# Patient Record
Sex: Female | Born: 1940 | Race: Black or African American | Hispanic: No | Smoking: Never smoker
Health system: Southern US, Community
[De-identification: ages and names within clinical notes are randomized; demographics above are authoritative.]

## PROBLEM LIST (undated history)

## (undated) DIAGNOSIS — K219 Gastro-esophageal reflux disease without esophagitis: Secondary | ICD-10-CM

## (undated) DIAGNOSIS — M549 Dorsalgia, unspecified: Secondary | ICD-10-CM

## (undated) DIAGNOSIS — I10 Essential (primary) hypertension: Secondary | ICD-10-CM

## (undated) DIAGNOSIS — F32A Depression, unspecified: Secondary | ICD-10-CM

## (undated) DIAGNOSIS — E119 Type 2 diabetes mellitus without complications: Secondary | ICD-10-CM

## (undated) DIAGNOSIS — G8929 Other chronic pain: Secondary | ICD-10-CM

## (undated) DIAGNOSIS — K5900 Constipation, unspecified: Secondary | ICD-10-CM

## (undated) DIAGNOSIS — F329 Major depressive disorder, single episode, unspecified: Secondary | ICD-10-CM

## (undated) HISTORY — PX: BACK SURGERY: SHX140

---

## 2016-10-20 ENCOUNTER — Emergency Department (HOSPITAL_BASED_OUTPATIENT_CLINIC_OR_DEPARTMENT_OTHER)
Admission: EM | Admit: 2016-10-20 | Discharge: 2016-10-20 | Disposition: A | Discharge status: Home / Self Care | Payer: Medicare HMO | Attending: Emergency Medicine | Admitting: Emergency Medicine

## 2016-10-20 ENCOUNTER — Encounter (HOSPITAL_BASED_OUTPATIENT_CLINIC_OR_DEPARTMENT_OTHER): Payer: Self-pay | Admitting: Emergency Medicine

## 2016-10-20 ENCOUNTER — Emergency Department (HOSPITAL_BASED_OUTPATIENT_CLINIC_OR_DEPARTMENT_OTHER): Payer: Medicare HMO

## 2016-10-20 DIAGNOSIS — M5441 Lumbago with sciatica, right side: Secondary | ICD-10-CM | POA: Insufficient documentation

## 2016-10-20 DIAGNOSIS — I10 Essential (primary) hypertension: Secondary | ICD-10-CM | POA: Insufficient documentation

## 2016-10-20 DIAGNOSIS — E119 Type 2 diabetes mellitus without complications: Secondary | ICD-10-CM | POA: Diagnosis not present

## 2016-10-20 DIAGNOSIS — M545 Low back pain: Secondary | ICD-10-CM

## 2016-10-20 DIAGNOSIS — M549 Dorsalgia, unspecified: Secondary | ICD-10-CM | POA: Diagnosis present

## 2016-10-20 DIAGNOSIS — M25551 Pain in right hip: Secondary | ICD-10-CM | POA: Insufficient documentation

## 2016-10-20 DIAGNOSIS — G8929 Other chronic pain: Secondary | ICD-10-CM | POA: Diagnosis not present

## 2016-10-20 HISTORY — DX: Other chronic pain: G89.29

## 2016-10-20 HISTORY — DX: Constipation, unspecified: K59.00

## 2016-10-20 HISTORY — DX: Essential (primary) hypertension: I10

## 2016-10-20 HISTORY — DX: Depression, unspecified: F32.A

## 2016-10-20 HISTORY — DX: Major depressive disorder, single episode, unspecified: F32.9

## 2016-10-20 HISTORY — DX: Gastro-esophageal reflux disease without esophagitis: K21.9

## 2016-10-20 HISTORY — DX: Dorsalgia, unspecified: M54.9

## 2016-10-20 HISTORY — DX: Type 2 diabetes mellitus without complications: E11.9

## 2016-10-20 LAB — URINALYSIS, ROUTINE W REFLEX MICROSCOPIC
Bilirubin Urine: NEGATIVE
GLUCOSE, UA: NEGATIVE mg/dL
Hgb urine dipstick: NEGATIVE
KETONES UR: NEGATIVE mg/dL
NITRITE: NEGATIVE
PROTEIN: NEGATIVE mg/dL
Specific Gravity, Urine: <1.005 — ABNORMAL LOW (ref 1.005–1.030)
pH: 6.5 (ref 5.0–8.0)

## 2016-10-20 LAB — URINALYSIS, MICROSCOPIC (REFLEX)

## 2016-10-20 MED ORDER — CAMPHOR-MENTHOL-METHYL SAL 1.2-5.7-6.3 % EX PTCH: 1.0000 "application " | MEDICATED_PATCH | Freq: Every day | CUTANEOUS | 0 refills | Status: AC

## 2016-10-20 MED ORDER — ACETAMINOPHEN 325 MG PO TABS
650.0000 mg | ORAL_TABLET | Freq: Once | ORAL | Status: AC
  Administered 2016-10-20: 650 mg via ORAL
  Filled 2016-10-20: qty 2

## 2016-10-20 NOTE — Discharge Instructions (Signed)
Please see the information and instructions below regarding your visit.  Your diagnoses today include:  1. Acute right-sided low back pain, with sciatica presence unspecified   2. Pain of right hip joint    About diagnosis. Most episodes of acute low back pain are self-limited. Your exam was reassuring today that the source of your pain is not affecting the spinal cord and nerves that originate in the spinal cord.   If you have a history of disc herniation or arthritis in your spine, the nerves exiting the spine on one side get inflamed. This can cause severe pain. We call this radiculopathy. We do not always know what causes the sudden inflammation. Sometimes radiculopathy can radiate into the hip.  Hip pain can also be caused by inflamed bursa, which was indicated on your xray. This may be chronic.  Tests performed today include: See side panel of your discharge paperwork for testing performed today. Vital signs are listed at the bottom of these instructions.   -Xray of lumbar spine -Xray of hip  Medications prescribed:    Take any prescribed medications only as prescribed, and any over the counter medications only as directed on the packaging.  Please apply salon process needed for relief. You may take Tylenol, 650 mg, as needed for pain. Please do not exceed 4 g of Tylenol in one day. Please see your primary care provider within one week before continuing therapy with Tylenol beyond 7 days.  Home care instructions:   Low back pain gets worse the longer you stay stationary. Please keep moving and walking as tolerated. There are exercises included in this packet to perform as tolerated for your low back pain.   Apply heat to the areas that are painful. Avoid twisting or bending your trunk to lift something. Do not lift anything above 25 lbs while recovering from this flare of low back pain.  Please follow any educational materials contained in this packet.   Follow-up  instructions: Please follow-up with your primary care provider in one week for further evaluation of your symptoms if they are not completely improved.   Please follow up with orthopedics or neurosurgery as needed for reevaluation. This will have to be set up by her primary care provider.  Return instructions:  Please return to the Emergency Department if you experience worsening symptoms.  Please return for any fever or chills in the setting of your back pain, weakness in the muscles of the legs, numbness in your legs and feet that is new or changing, numbness in the area where you wipe, retention of your urine, loss of bowel or bladder control, or problems with walking. Please return if you have any other emergent concerns.  Additional Information:  Please use your cane as needed to ensure that you do not fall. Please continue to move with a near ability to keep those muscles in her back moving.  Your vital signs today were: BP 125/63 (BP Location: Right Arm)    Pulse 64    Temp 98.3 F (36.8 C) (Oral)    Resp 18    Ht 5\' 4"  (1.626 m)    Wt 103.4 kg (228 lb)    SpO2 100%    BMI 39.14 kg/m  If your blood pressure (BP) was elevated on multiple readings during this visit above 130 for the top number or above 80 for the bottom number, please have this repeated by your primary care provider within one month. --------------  Thank you for allowing  [place] to participate in your care today.

## 2016-10-20 NOTE — ED Provider Notes (Signed)
[facility] EMERGENCY DEPARTMENT Provider Note   CSN: [id number] Arrival date & time: 10/20/16  1143     History   Chief Complaint Chief Complaint  Patient presents with  . Back Pain    HPI Laura French is a 76 y.o. female.  HPI   Patient is a 76 year old female with a history of back pain (with multiple surgeries, last in 2013), diabetes mellitus, and hypertension presenting for acute right lower back pain and hip pain. Patient is accompanied by her daughter. History obtained entirely by patient. Patient reports that she woke up with symptoms 2-3 days ago, and she has progressively had more pain with movement as well as inability to bear weight on that right extremity due to the pain today. Patient denies fever, chills, muscular weakness in the extremity, new numbness in the right lower extremity. Patient denies any saddle anesthesia, acute urinary retention, loss of bowel or bladder control. Patient does not use IV drugs. Patient has no history of cancer. Patient denies abdominal pain, flank pain, dysuria, or vaginal discharge. Patient does not drink alcohol. Patient has been trying topical therapies without relief. Patient goes to a pain clinic for her chronic back pain where she receives Zanaflex and what she believes is Cymbalta, however she cannot remember the name.   On chart review, no MRIs are available in this system for baseline.  Past Medical History:  Diagnosis Date  . Chronic back pain   . Constipation   . Depression   . Diabetes mellitus without complication (HCC)   . GERD (gastroesophageal reflux disease)   . Hypertension     There are no active problems to display for this patient.   Past Surgical History:  Procedure Laterality Date  . BACK SURGERY      OB History    No data available       Home Medications    Prior to Admission medications   Not on File    Family History No family history on file.  Social History Social History   Substance Use Topics  . Smoking status: Never Smoker  . Smokeless tobacco: Never Used  . Alcohol use No     Allergies   Hydrocodone and Potassium-containing compounds   Review of Systems Review of Systems  Constitutional: Negative for chills and fever.  Cardiovascular: Negative for leg swelling.  Gastrointestinal: Negative for nausea and vomiting.  Genitourinary: Negative for difficulty urinating, dysuria, flank pain, pelvic pain and vaginal discharge.  Musculoskeletal: Positive for arthralgias, back pain and myalgias.  Neurological: Negative for weakness and numbness.     Physical Exam Updated Vital Signs BP (!) 120/57 (BP Location: Right Arm)   Pulse 66   Temp 98.3 F (36.8 C) (Oral)   Resp 18   Ht 5\' 4"  (1.626 m)   Wt 103.4 kg (228 lb)   SpO2 98%   BMI 39.14 kg/m   Physical Exam  Constitutional: She appears well-developed and well-nourished. No distress.  Sitting comfortably in bed.  HENT:  Head: Normocephalic and atraumatic.  Eyes: Conjunctivae are normal. Right eye exhibits no discharge. Left eye exhibits no discharge.  EOMs normal to gross examination.  Neck: Normal range of motion.  Cardiovascular: Normal rate, regular rhythm and normal heart sounds.   Pulmonary/Chest:  Normal respiratory effort. Patient converses comfortably. No audible wheeze or stridor.  Abdominal: Soft. She exhibits no distension. There is no tenderness. There is no guarding.  Musculoskeletal: She exhibits tenderness. She exhibits no deformity.  No erythema  or edema of right hip. Point tenderness to palpation over greater trochanter, sacroiliac joint. No crepitus palpated. Passive ROM intact to internal and external rotation. Active abduction and adduction of the right hip diminished due to pain.  Neurological: She is alert.  Spine Exam: Inspection/Palpation: no midline tenderness to palpation of lumbar spine. Tenderness to palpation of right lumbar paraspinal musculature. Strength:  5/5 throughout LE bilaterally (hip flexion/extension, adduction/abduction; knee flexion/extension; foot dorsiflexion/plantarflexion, inversion/eversion; great toe inversion) Sensation: Intact to light touch in proximal and distal LE bilaterally Reflexes: `1+ quadriceps and achilles reflexes and symmetric. Gait: Antalgic gait but no evidence of right extremity weakness, foot drop or hemiparesis. Gait is coordinated and hip movements are symmetric.  Skin: Skin is warm and dry. She is not diaphoretic.  Psychiatric: She has a normal mood and affect. Her behavior is normal. Judgment and thought content normal.  Nursing note and vitals reviewed.    ED Treatments / Results  Labs (all labs ordered are listed, but only abnormal results are displayed) Labs Reviewed  URINALYSIS, ROUTINE W REFLEX MICROSCOPIC - Abnormal; Notable for the following:       Result Value   Specific Gravity, Urine <1.005 (*)    Leukocytes, UA TRACE (*)    All other components within normal limits  URINALYSIS, MICROSCOPIC (REFLEX) - Abnormal; Notable for the following:    Bacteria, UA RARE (*)    Squamous Epithelial / LPF 0-5 (*)    All other components within normal limits    EKG  EKG Interpretation None       Radiology No results found.  Procedures Procedures (including critical care time)  Medications Ordered in ED Medications  acetaminophen (TYLENOL) tablet 650 mg (not administered)     Initial Impression / Assessment and Plan / ED Course  I have reviewed the triage vital signs and the nursing notes.  Pertinent labs & imaging results that were available during my care of the patient were reviewed by me and considered in my medical decision making (see chart for details).     Final Clinical Impressions(s) / ED Diagnoses   Final diagnoses:  None   Differential diagnosis includes sciatica, lumbar radiculopathy, sacroiliitis, trochanteric bursitis. Patient denies any concerning symptoms suggestive  of cauda equina requiring urgent imaging at this time such as loss of sensation in the lower extremities, lower extremity weakness, loss of bowel or bladder control, saddle anesthesia, urinary retention, fever/chills, IVDU, or history of cancer. Will proceed with xrays and Tylenol for pain control.   On reevaluation, patient had significant improvement in symptoms after Tylenol. Patient was able to ambulate. Patient demonstrated antalagic gait but was ambulating without difficulty.  Exam demonstrated no  Weakness today. No preceding injury or trauma to suggest acute fracture. Doubt pelvic or urinary pathology for patient's acute back pain, as patient denies urinary symptoms, has no evidence of infection on UA, history/pain not consistent with nephrolithiasis, and has no vaginal discharge. UA did demonstrate some trace leukocytes, but patient is asymptomatic of dysuria, urgency, frequency, or systemic symptoms and this was a contaminated sample. Urine sent for culture and patient informed of these results. Imaging obtained due to history of hardware in spine and need to assess for any new compression fractures, worsening degenerative joint disease, or lytic lesions. Lumbar spine x-ray demonstrates retrolisthesis at L1-L2.No alteration or lucency of hardware at fusions. Severe disc space narrowing and L1-L2 region. 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 His findings are nonacute, and patient significantly improved and was  able to ambulate after treatment with Tylenol, patient instructed to apply [place] for pain control, Tylenol, and follow up with primary care provider to make orthopedic referral. Patient with contraindications for steroid treatment due to diabetes, and with unclear history of muscle strain versus sciatica. Patient given strict return precautions for any symptoms indicating worsening neurologic function in the lower extremities.  Nursing notes reviewed. Vital signs  reviewed. All questions answered by patient and family.  This is a shared visit with Dr. Drema Pry. Patient was independently evaluated by this attending physician. Attending physician consulted in evaluation and discharge management.  New Prescriptions New Prescriptions   No medications on file     Delia Chimes 10/20/16 1821    Nira Conn, MD 10/21/16 [phone]

## 2016-10-20 NOTE — ED Triage Notes (Signed)
Patient states that she has had right sided flank to hip pain - x 2 -3 days  - the patient reports that today she was unable to stand up

## 2016-10-20 NOTE — ED Notes (Signed)
ED Provider at bedside. 

## 2016-10-22 LAB — URINE CULTURE: Culture: 10000 — AB

## 2016-10-23 ENCOUNTER — Telehealth: Payer: Self-pay

## 2016-10-23 NOTE — Telephone Encounter (Signed)
Post ED Visit - Positive Culture Follow-up  Culture report reviewed by antimicrobial stewardship pharmacist:  []  Enzo BiNathan Batchelder, Pharm.D. []  Celedonio MiyamotoJeremy Frens, Pharm.D., BCPS AQ-ID []  Garvin FilaMike Maccia, Pharm.D., BCPS []  Georgina PillionElizabeth Martin, Pharm.D., BCPS []  [place] ValleyMinh Pham, [address] Rainbow BoulevardPharm.D., BCPS, AAHIVP []  Estella HuskMichelle Turner, Pharm.D., BCPS, AAHIVP []  Lysle Pearlachel Rumbarger, PharmD, BCPS [x]  Casilda Carlsaylor Stone, PharmD, BCPS []  Pollyann SamplesAndy Johnston, PharmD, BCPS  Positive urine culture  and no further patient follow-up is required at this time. Per Arthor CaptainAbigail Harris PA Jerry Carasullom, Kaydan Wong Burnett 10/23/2016, 12:14 PM

## 2018-10-24 IMAGING — DX DG LUMBAR SPINE COMPLETE 4+V
5 series · 5 of 5 positions shown · non-contrast
Comparison: None.

CLINICAL DATA: Right-sided flank and hip pain

EXAM:
LUMBAR SPINE - COMPLETE 4+ VIEW

[l-spine ap]
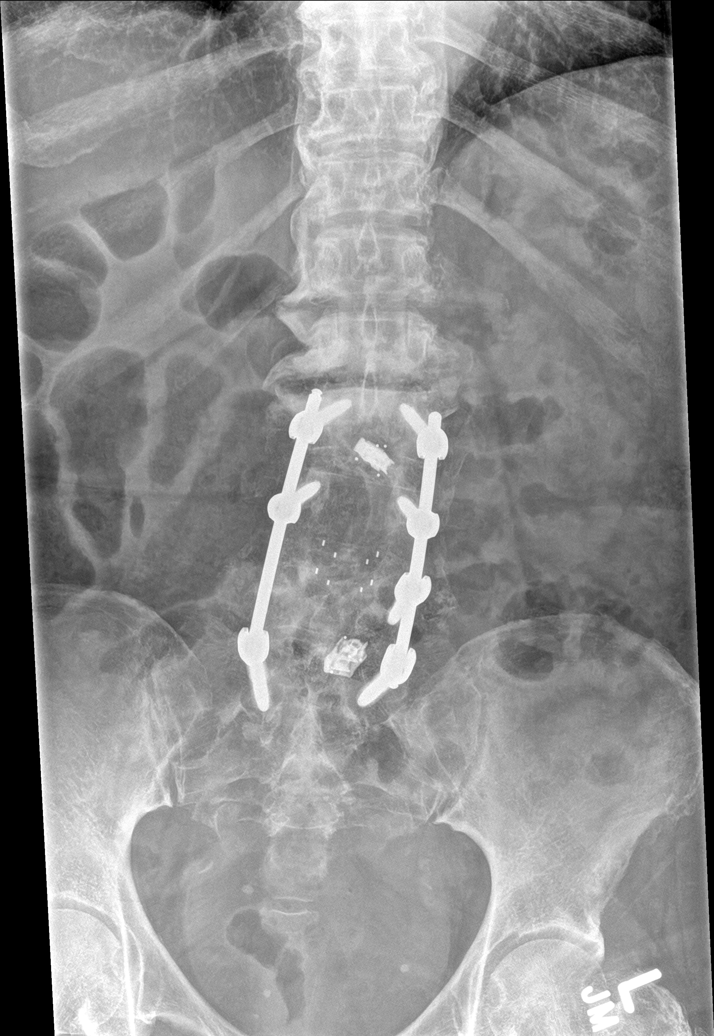

[l-spine obl (1 of 2)]
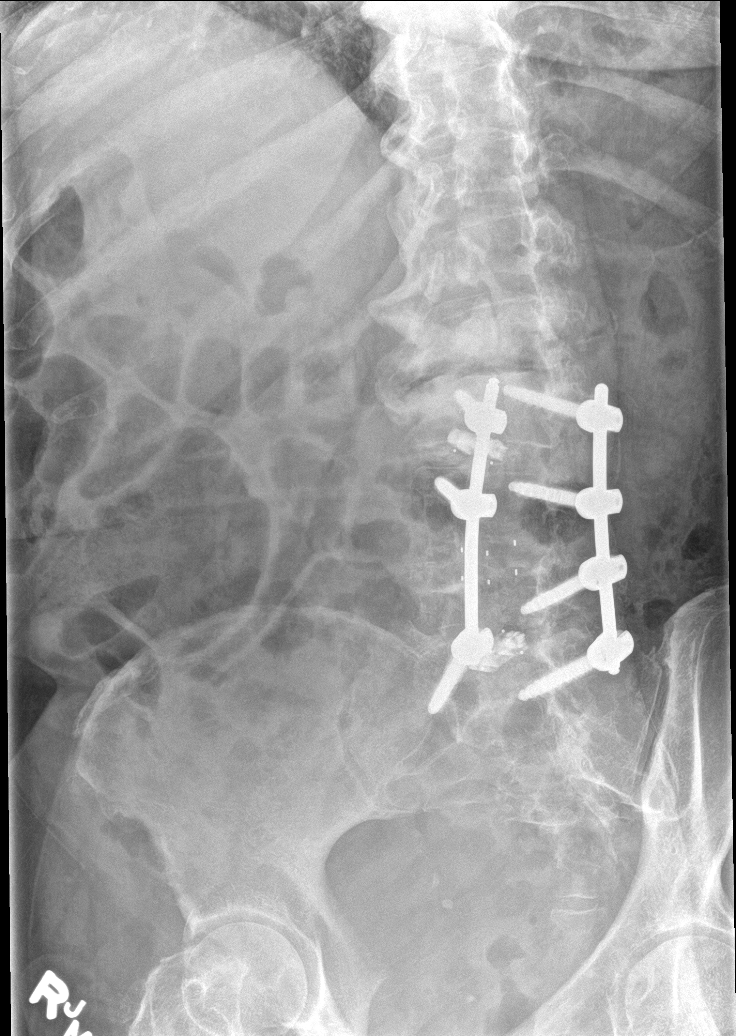

[l-spine obl (2 of 2)]
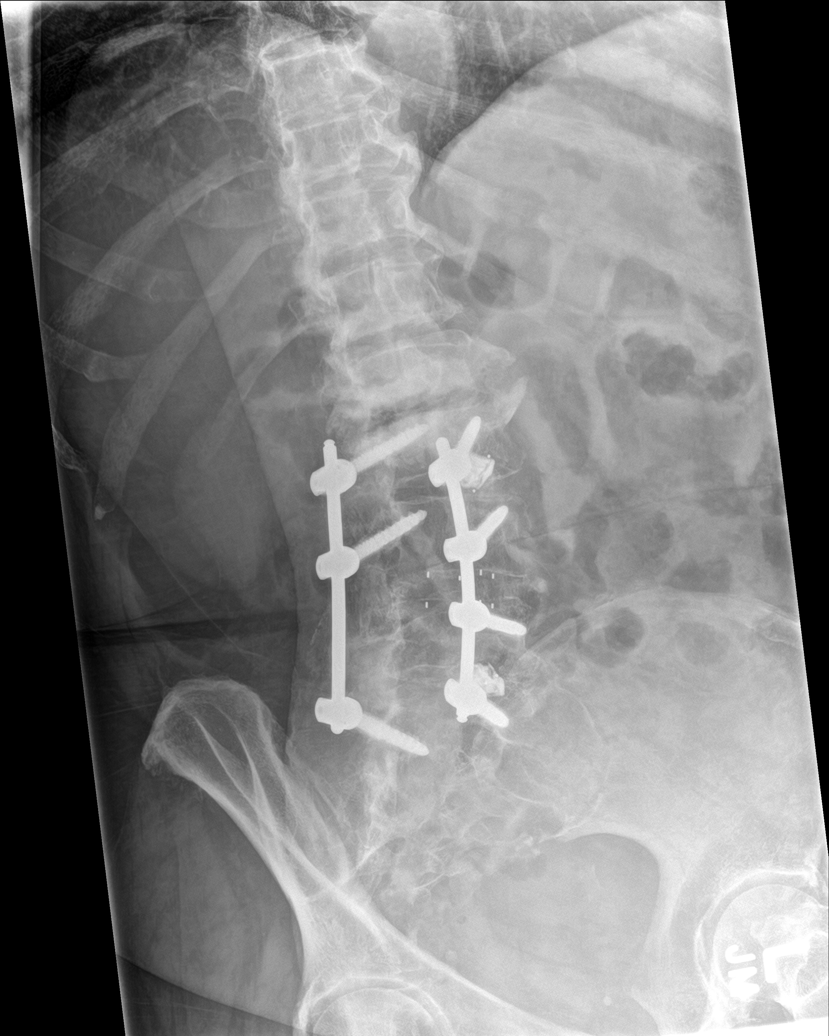

[l-spine lat]
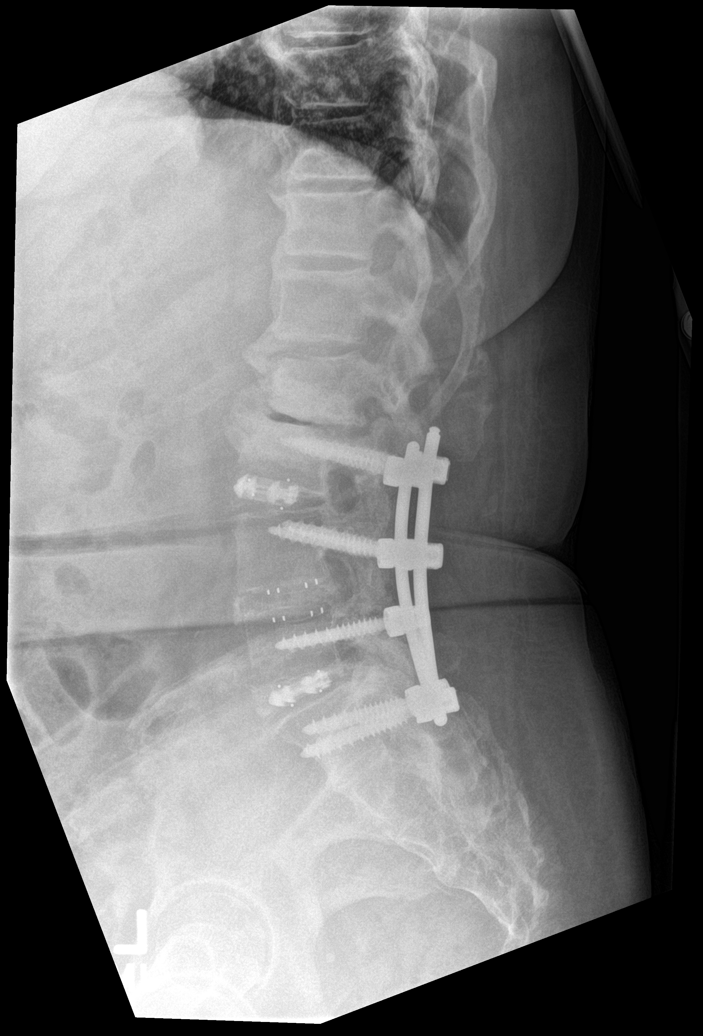

[l-spine spot]
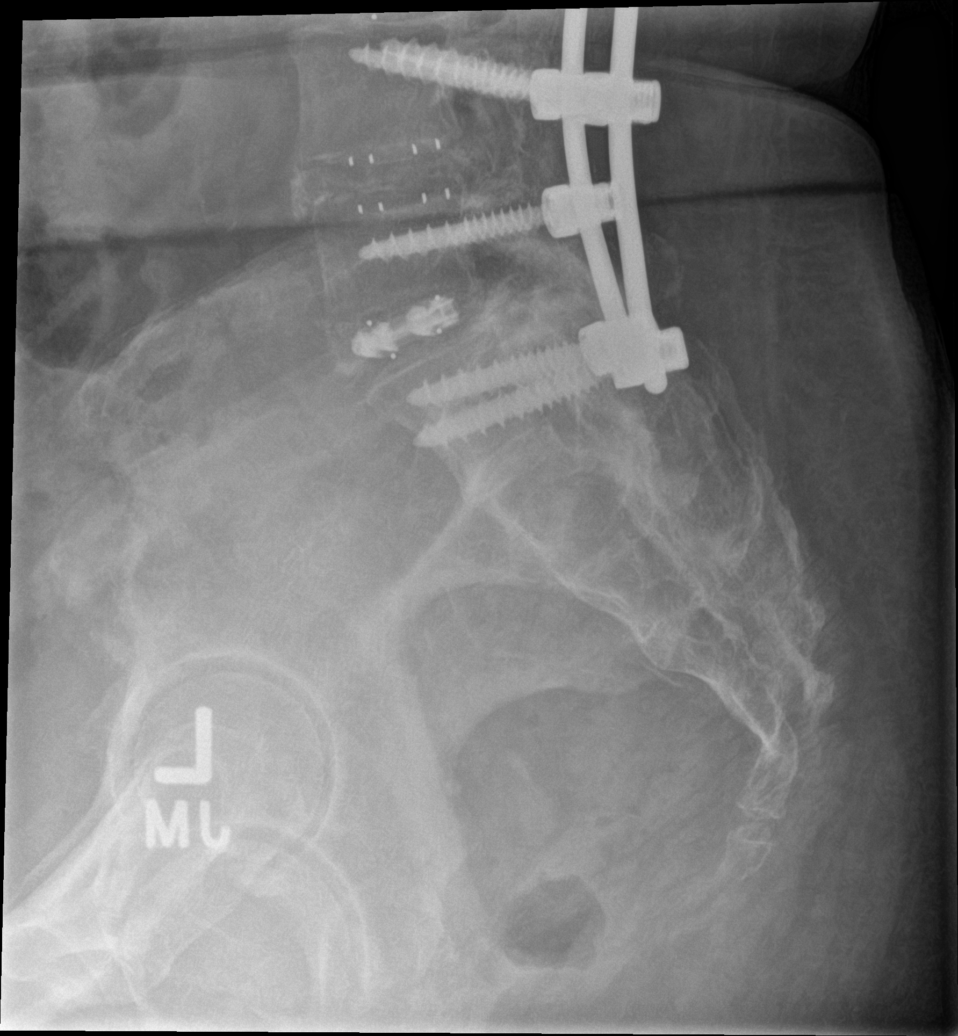

[5 of 5 positions shown; findings below may reference images not displayed]

FINDINGS: There is transitional lumbosacral anatomy with a sacralized L5.
There are hypoplastic ribs at the T12 level. For the purposes of
this dictation, the lowest level of the posterior fusion hardware is
considered L5.

L2-L5 PLIF without abnormal lucency surrounding the hardware. There
is grade 2 retrolisthesis at L1-L2 with severe endplate sclerosis
and narrowing of the disc space. There is moderate to severe
narrowing of the neural foramina at this level. No acute fracture.
There is multilevel lower thoracic mild vertebral body height loss
and anterior osteophyte formation.
IMPRESSION: 1. Transitional lumbar anatomy with sacralized L5.
2. L2-L5 PLIF without adverse features.
3. Grade 2 retrolisthesis at L1-L2 with severe disc space narrowing
and endplate remodeling and moderate to severe bilateral neural
foraminal stenosis.

## 2018-10-24 IMAGING — DX DG HIP (WITH OR WITHOUT PELVIS) 2-3V*R*
3 series · 3 of 3 positions shown · non-contrast
Comparison: None.

CLINICAL DATA: Back pain

EXAM:
DG HIP (WITH OR WITHOUT PELVIS) 2-3V RIGHT

[pelvis ap]
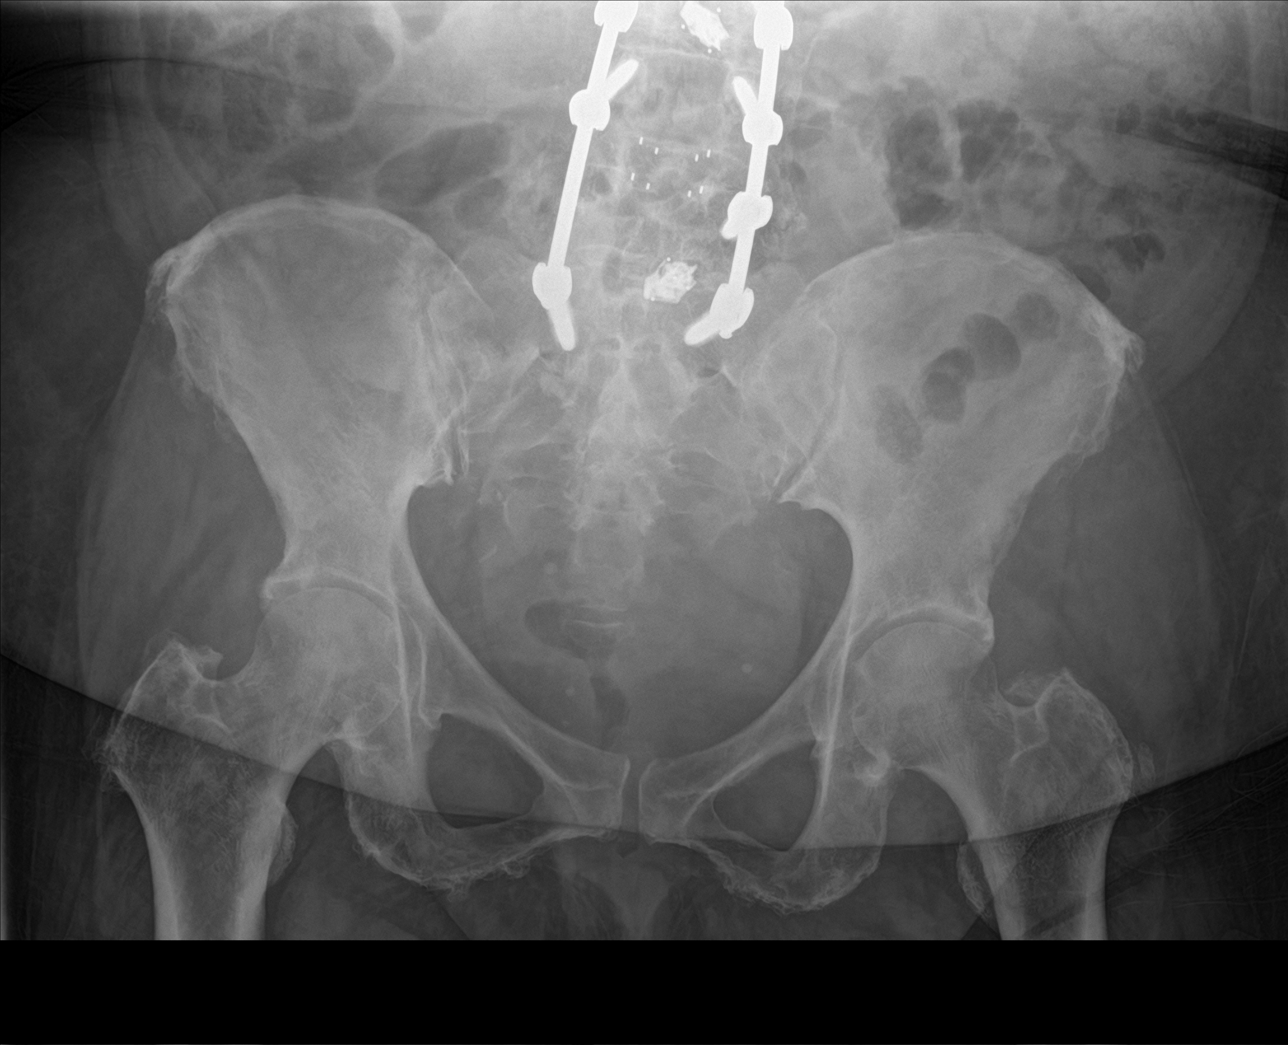

[hip ap]
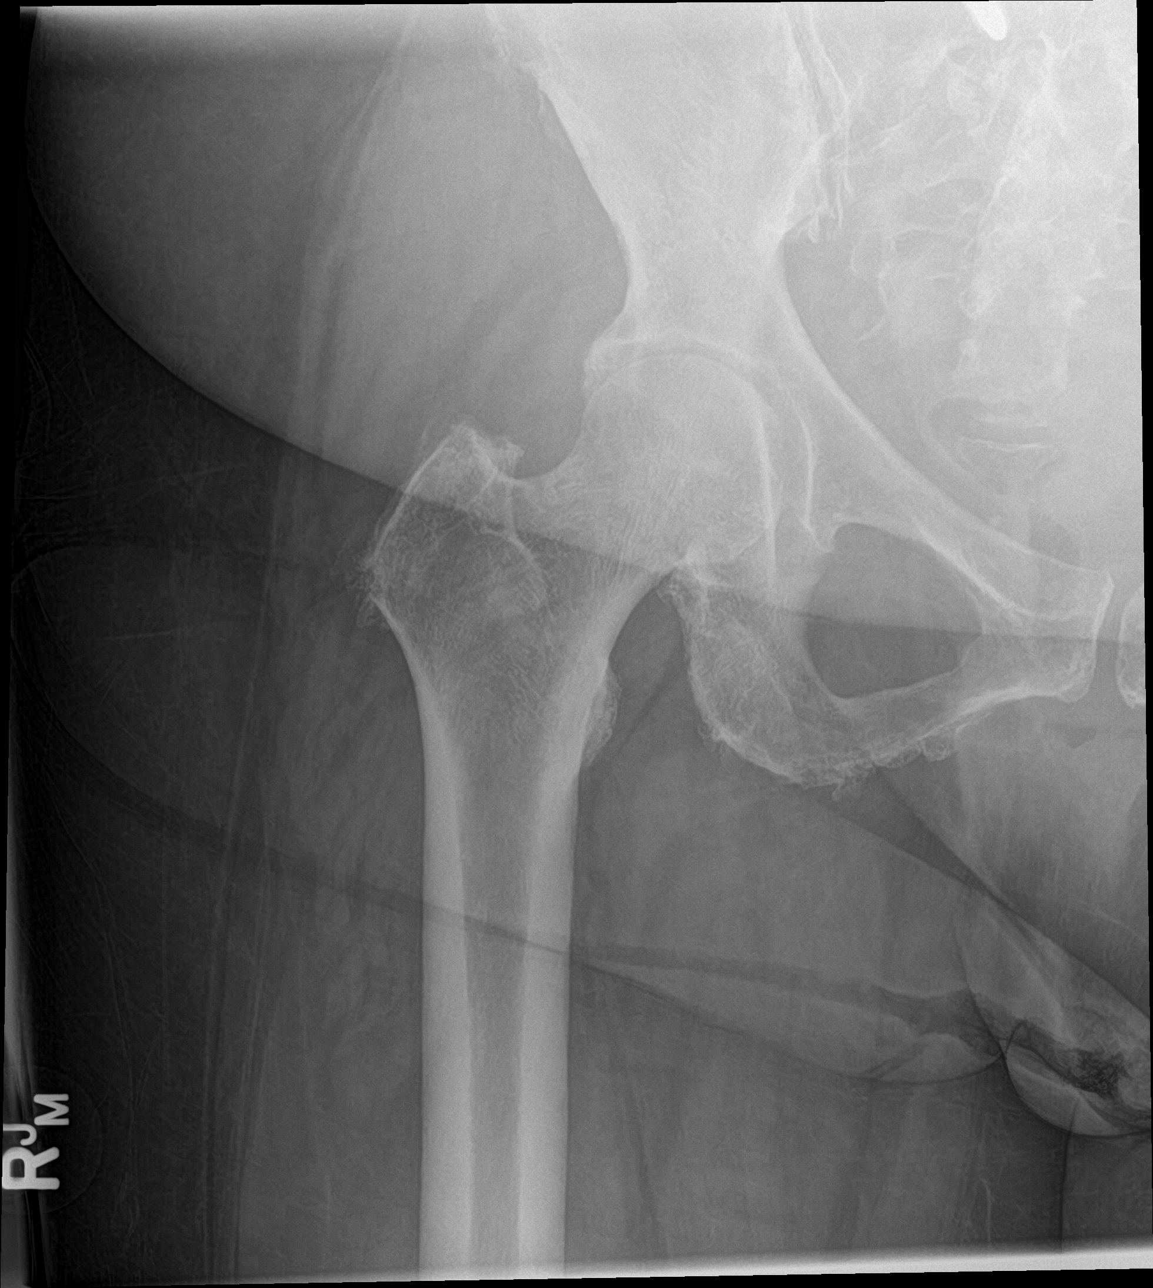

[hip frog leg]
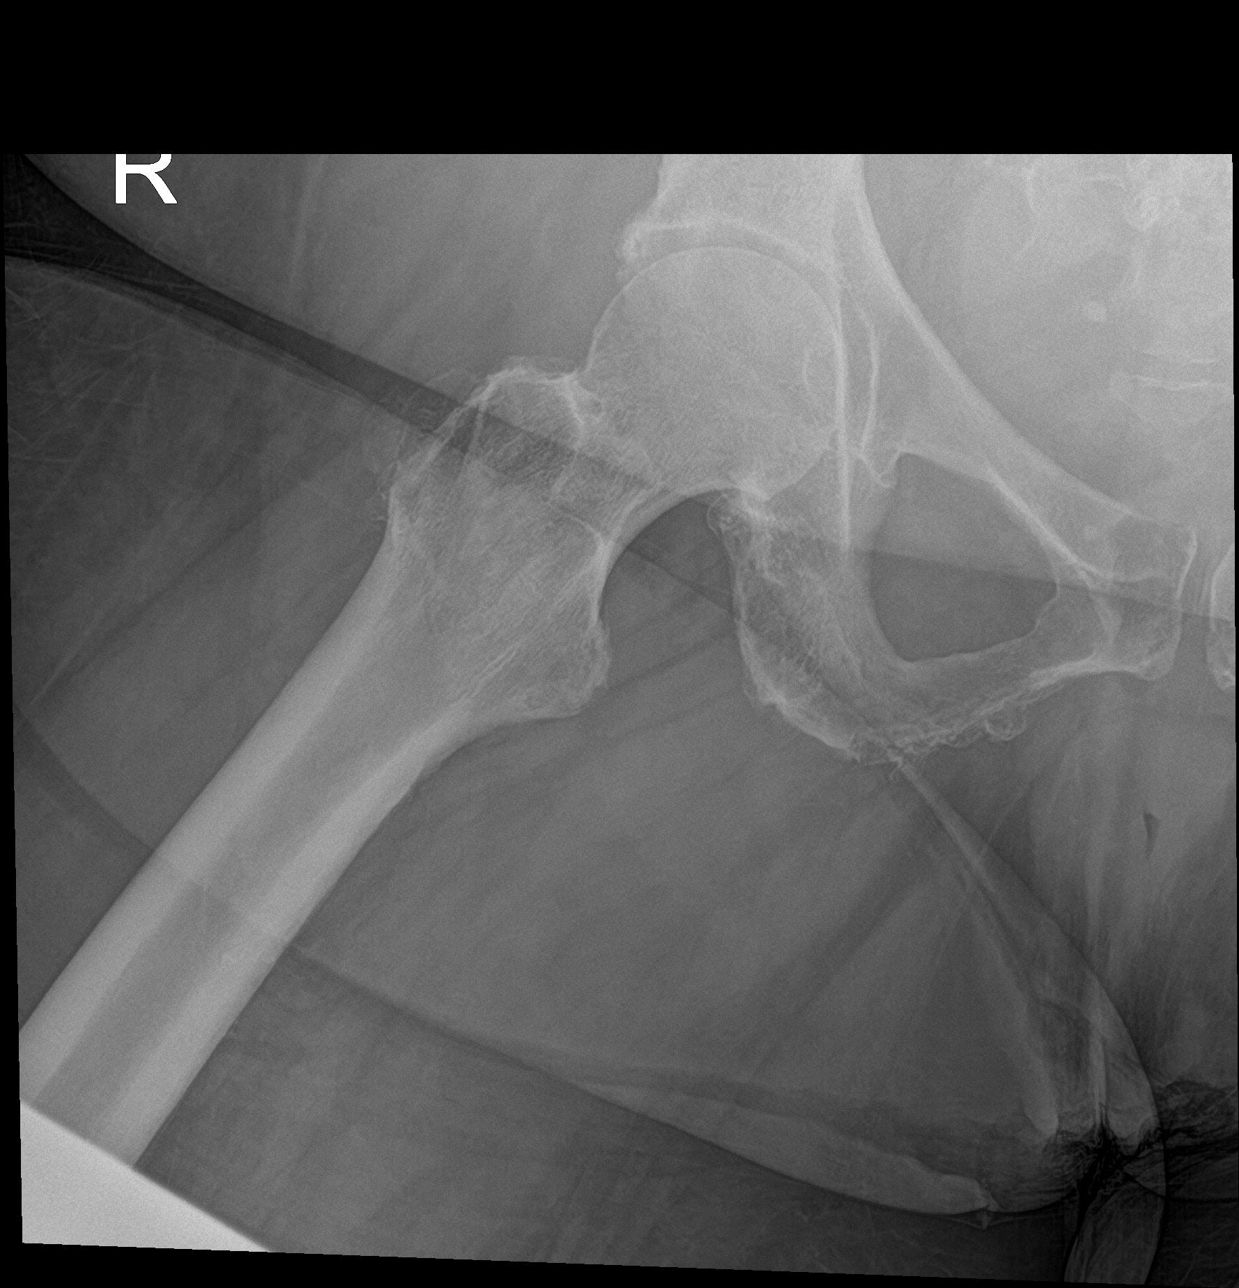

[3 of 3 positions shown; findings below may reference images not displayed]

FINDINGS: Normal alignment no fracture. Mild irregularity of the greater
trochanter compatible chronic bursitis. Hip joint normal.

Lumbar fusion with hardware.
IMPRESSION: Negative right hip.  Chronic bursitis greater trochanter.

## 2019-10-18 ENCOUNTER — Encounter (HOSPITAL_BASED_OUTPATIENT_CLINIC_OR_DEPARTMENT_OTHER): Payer: Self-pay | Admitting: Emergency Medicine

## 2019-10-18 ENCOUNTER — Observation Stay (HOSPITAL_BASED_OUTPATIENT_CLINIC_OR_DEPARTMENT_OTHER)
Admission: EM | Admit: 2019-10-18 | Discharge: 2019-10-21 | Disposition: A | Discharge status: Home / Self Care | Payer: Medicare HMO | Attending: Internal Medicine | Admitting: Internal Medicine

## 2019-10-18 ENCOUNTER — Other Ambulatory Visit: Payer: Self-pay

## 2019-10-18 DIAGNOSIS — D649 Anemia, unspecified: Secondary | ICD-10-CM | POA: Diagnosis not present

## 2019-10-18 DIAGNOSIS — T464X5A Adverse effect of angiotensin-converting-enzyme inhibitors, initial encounter: Secondary | ICD-10-CM | POA: Diagnosis not present

## 2019-10-18 DIAGNOSIS — E119 Type 2 diabetes mellitus without complications: Secondary | ICD-10-CM | POA: Insufficient documentation

## 2019-10-18 DIAGNOSIS — D72829 Elevated white blood cell count, unspecified: Secondary | ICD-10-CM

## 2019-10-18 DIAGNOSIS — N179 Acute kidney failure, unspecified: Secondary | ICD-10-CM

## 2019-10-18 DIAGNOSIS — I10 Essential (primary) hypertension: Secondary | ICD-10-CM | POA: Insufficient documentation

## 2019-10-18 DIAGNOSIS — I160 Hypertensive urgency: Secondary | ICD-10-CM | POA: Insufficient documentation

## 2019-10-18 DIAGNOSIS — A498 Other bacterial infections of unspecified site: Secondary | ICD-10-CM

## 2019-10-18 DIAGNOSIS — T783XXA Angioneurotic edema, initial encounter: Secondary | ICD-10-CM | POA: Diagnosis not present

## 2019-10-18 DIAGNOSIS — N390 Urinary tract infection, site not specified: Secondary | ICD-10-CM | POA: Diagnosis not present

## 2019-10-18 DIAGNOSIS — Z7984 Long term (current) use of oral hypoglycemic drugs: Secondary | ICD-10-CM | POA: Insufficient documentation

## 2019-10-18 DIAGNOSIS — R7303 Prediabetes: Secondary | ICD-10-CM

## 2019-10-18 DIAGNOSIS — R220 Localized swelling, mass and lump, head: Secondary | ICD-10-CM | POA: Diagnosis present

## 2019-10-18 DIAGNOSIS — Z20822 Contact with and (suspected) exposure to covid-19: Secondary | ICD-10-CM | POA: Diagnosis not present

## 2019-10-18 DIAGNOSIS — Z79899 Other long term (current) drug therapy: Secondary | ICD-10-CM | POA: Insufficient documentation

## 2019-10-18 LAB — COMPREHENSIVE METABOLIC PANEL
ALT: 12 U/L (ref 0–44)
AST: 21 U/L (ref 15–41)
Albumin: 3.9 g/dL (ref 3.5–5.0)
Alkaline Phosphatase: 83 U/L (ref 38–126)
Anion gap: 13 (ref 5–15)
BUN: 17 mg/dL (ref 8–23)
CO2: 23 mmol/L (ref 22–32)
Calcium: 8.7 mg/dL — ABNORMAL LOW (ref 8.9–10.3)
Chloride: 99 mmol/L (ref 98–111)
Creatinine, Ser: 1.23 mg/dL — ABNORMAL HIGH (ref 0.44–1.00)
GFR, Estimated: 41 mL/min — ABNORMAL LOW (ref >60)
Glucose, Bld: 130 mg/dL — ABNORMAL HIGH (ref 70–99)
Potassium: 4 mmol/L (ref 3.5–5.1)
Sodium: 135 mmol/L (ref 135–145)
Total Bilirubin: 0.2 mg/dL — ABNORMAL LOW (ref 0.3–1.2)
Total Protein: 8 g/dL (ref 6.5–8.1)

## 2019-10-18 LAB — CBC WITH DIFFERENTIAL/PLATELET
Abs Immature Granulocytes: 0.04 10*3/uL (ref 0.00–0.07)
Basophils Absolute: 0 10*3/uL (ref 0.0–0.1)
Basophils Relative: 0 %
Eosinophils Absolute: 0.2 10*3/uL (ref 0.0–0.5)
Eosinophils Relative: 2 %
HCT: 35.7 % — ABNORMAL LOW (ref 36.0–46.0)
Hemoglobin: 11 g/dL — ABNORMAL LOW (ref 12.0–15.0)
Immature Granulocytes: 0 %
Lymphocytes Relative: 35 %
Lymphs Abs: 4.5 10*3/uL — ABNORMAL HIGH (ref 0.7–4.0)
MCH: 24.6 pg — ABNORMAL LOW (ref 26.0–34.0)
MCHC: 30.8 g/dL (ref 30.0–36.0)
MCV: 79.7 fL — ABNORMAL LOW (ref 80.0–100.0)
Monocytes Absolute: 0.8 10*3/uL (ref 0.1–1.0)
Monocytes Relative: 7 %
Neutro Abs: 7.2 10*3/uL (ref 1.7–7.7)
Neutrophils Relative %: 56 %
Platelets: 343 10*3/uL (ref 150–400)
RBC: 4.48 MIL/uL (ref 3.87–5.11)
RDW: 16.8 % — ABNORMAL HIGH (ref 11.5–15.5)
WBC: 12.8 10*3/uL — ABNORMAL HIGH (ref 4.0–10.5)
nRBC: 0 % (ref 0.0–0.2)

## 2019-10-18 LAB — RESP PANEL BY RT PCR (RSV, FLU A&B, COVID)
Influenza A by PCR: NEGATIVE
Influenza B by PCR: NEGATIVE
Respiratory Syncytial Virus by PCR: NEGATIVE
SARS Coronavirus 2 by RT PCR: NEGATIVE

## 2019-10-18 MED ORDER — EPINEPHRINE 0.15 MG/0.3ML IJ SOAJ
INTRAMUSCULAR | Status: AC
  Filled 2019-10-18: qty 0.3

## 2019-10-18 MED ORDER — FAMOTIDINE IN NACL 20-0.9 MG/50ML-% IV SOLN
INTRAVENOUS | Status: AC
  Administered 2019-10-18: 20 mg via INTRAVENOUS
  Filled 2019-10-18: qty 50

## 2019-10-18 MED ORDER — FAMOTIDINE IN NACL 20-0.9 MG/50ML-% IV SOLN
20.0000 mg | Freq: Once | INTRAVENOUS | Status: AC
  Filled 2019-10-18: qty 50

## 2019-10-18 MED ORDER — EPINEPHRINE 0.3 MG/0.3ML IJ SOAJ: 0.3000 mg | Freq: Once | INTRAMUSCULAR | Status: DC

## 2019-10-18 MED ORDER — DIPHENHYDRAMINE HCL 50 MG/ML IJ SOLN: 25.0000 mg | Freq: Once | INTRAMUSCULAR | Status: AC

## 2019-10-18 MED ORDER — DIPHENHYDRAMINE HCL 50 MG/ML IJ SOLN
INTRAMUSCULAR | Status: AC
  Administered 2019-10-18: 25 mg via INTRAVENOUS
  Filled 2019-10-18: qty 1

## 2019-10-18 MED ORDER — METHYLPREDNISOLONE SODIUM SUCC 125 MG IJ SOLR
125.0000 mg | Freq: Once | INTRAMUSCULAR | Status: DC
  Filled 2019-10-18: qty 2

## 2019-10-18 MED ORDER — METHYLPREDNISOLONE SODIUM SUCC 125 MG IJ SOLR
INTRAMUSCULAR | Status: AC
  Administered 2019-10-18: 125 mg
  Filled 2019-10-18: qty 2

## 2019-10-18 NOTE — ED Triage Notes (Signed)
Tongue started swelling 2 hours PTA. No hx of same. Pt takes Lisinopril.

## 2019-10-18 NOTE — ED Notes (Signed)
Pt arrived via [facility]. Pt A+O x4. Vitals stable on 3L O2 and pressures elevated over 200 systolic. Provider notified.

## 2019-10-18 NOTE — ED Provider Notes (Signed)
This is an 79 year old female presenting as a transfer from [facility] with concern for angioedema.  Briefly she began having tongue swelling at 2 pm today.  [facility] where she was given allergic reaction medications including epinephrine and benadryl, with improvement of her symptoms.  She was transferred to [facility] ED for hospital observation admission.  Patient takes lisinopril which is felt to be the culprit of her angioedema.  She has no prior hx of angioedema.  On my exam she appears comfortable, continues having tongue swelling, but has a clear voice, no uvula swelling, no stridor.  She feels her swelling has significantly improved already.  She is hypertensive after her epi, will continue monitoring, but does not need aggressive treatment at this time.  This note is a brief emergency evaluation upon arrival - please see note by Dr Dalene Seltzer and Berle Mull, PA, for complete medical assessment.  The patient was accepted for admission by Dr Loney Loh prior to transfer.  Hospitalist team paged for admission.   Terald Sleeper, MD 10/18/19 [phone]

## 2019-10-18 NOTE — ED Provider Notes (Addendum)
[facility] EMERGENCY DEPARTMENT Provider Note   CSN: [id number] Arrival date & time: 10/18/19  1846     History Chief Complaint  Patient presents with  . Allergic Reaction    Laura French is a 79 y.o. female.  HPI   Patient with significant medical history of diabetes, acid reflux, hypertension presents to the emergency department with chief complaint of tongue swelling.  Patient's daughter is at bedside and HPI was obtained from her.  She states patient had bit her tongue earlier in the day and then her tongue started to swell up.  Patient's daughter endorses facial swelling without diffuse rash or hives noted.  Patient's daughter try to give her Tylenol but she is unable to swallow them due to her tongue swelling.  The patient has never experienced this before,  She denies new medications or exposures to new foods.  She does admit that she is on an ACE that she has been taking for years.  Patient denies fevers, chills, shortness of breath, chest pain, dumping, nausea, vomiting, diarrhea.  Past Medical History:  Diagnosis Date  . Chronic back pain   . Constipation   . Depression   . Diabetes mellitus without complication (HCC)   . GERD (gastroesophageal reflux disease)   . Hypertension     Patient Active Problem List   Diagnosis Date Noted  . Angioedema 10/18/2019    Past Surgical History:  Procedure Laterality Date  . BACK SURGERY       OB History   No obstetric history on file.     No family history on file.  Social History   Tobacco Use  . Smoking status: Never Smoker  . Smokeless tobacco: Never Used  Substance Use Topics  . Alcohol use: No  . Drug use: No    Home Medications Prior to Admission medications   Not on File    Allergies    Hydrocodone and Potassium-containing compounds  Review of Systems   Review of Systems  Physical Exam Updated Vital Signs BP (!) 219/86   Pulse 88   Temp 98.4 F (36.9 C) (Oral)   Resp 20   Ht  5\' 3"  (1.6 m)   Wt 104.3 kg   SpO2 100%   BMI 40.74 kg/m   Physical Exam Vitals and nursing note reviewed.  Constitutional:      General: She is in acute distress.     Appearance: She is not ill-appearing.  HENT:     Head: Normocephalic and atraumatic.     Comments: Patient face visualized bilateral cheeks were swollen, no rashes, erythema, laceration or abrasions noted.    Nose: No congestion.     Mouth/Throat:     Mouth: Mucous membranes are moist.     Pharynx: Oropharynx is clear.     Comments: Patient tonge was severely swollen, could only visualize proximal one third of uvula, she was controlling her own secretions without difficulty. Eyes:     General: No scleral icterus. Cardiovascular:     Rate and Rhythm: Normal rate and regular rhythm.     Pulses: Normal pulses.     Heart sounds: No murmur heard.  No friction rub. No gallop.   Pulmonary:     Effort: No respiratory distress.     Breath sounds: No wheezing, rhonchi or rales.     Comments: Patient had good rise and fall with respirations, no flail chest noted.  No signs of breath or chest noted.  Patient lung sounds are clear  bilaterally, no Rales, rhonchi's, stridor noted. Abdominal:     General: There is no distension.     Palpations: Abdomen is soft.     Tenderness: There is no abdominal tenderness. There is no right CVA tenderness, left CVA tenderness or guarding.  Musculoskeletal:        General: No swelling.  Skin:    General: Skin is warm and dry.     Capillary Refill: Capillary refill takes less than 2 seconds.     Findings: No rash.     Comments: Skin exam was performed no rashes, abrasions, lacerations, ecchymosis noted on exam.  Neurological:     General: No focal deficit present.     Mental Status: She is alert.  Psychiatric:        Mood and Affect: Mood normal.     ED Results / Procedures / Treatments   Labs (all labs ordered are listed, but only abnormal results are displayed) Labs Reviewed    COMPREHENSIVE METABOLIC PANEL - Abnormal; Notable for the following components:      Result Value   Glucose, Bld 130 (*)    Creatinine, Ser 1.23 (*)    Calcium 8.7 (*)    Total Bilirubin 0.2 (*)    GFR, Estimated 41 (*)    All other components within normal limits  CBC WITH DIFFERENTIAL/PLATELET - Abnormal; Notable for the following components:   WBC 12.8 (*)    Hemoglobin 11.0 (*)    HCT 35.7 (*)    MCV 79.7 (*)    MCH 24.6 (*)    RDW 16.8 (*)    Lymphs Abs 4.5 (*)    All other components within normal limits  RESP PANEL BY RT PCR (RSV, FLU A&B, COVID)    EKG EKG Interpretation  Date/Time:  Friday October 18 2019 18:56:50 EDT Ventricular Rate:  89 PR Interval:    QRS Duration: 98 QT Interval:  360 QTC Calculation: 438 R Axis:   -8 Text Interpretation: Sinus rhythm Consider anterior infarct No previous ECGs available Confirmed by Alvira Monday ([id number]) on 10/18/2019 8:23:21 PM   Radiology No results found.  Procedures .Critical Care Performed by: Carroll Sage, PA-C Authorized by: Carroll Sage, PA-C   Critical care provider statement:    Critical care time (minutes):  30   Critical care time was exclusive of:  Separately billable procedures and treating other patients   Critical care was necessary to treat or prevent imminent or life-threatening deterioration of the following conditions:  Respiratory failure (Angioedema with tongue swelling.)   Critical care was time spent personally by me on the following activities:  Discussions with consultants, evaluation of patient's response to treatment, examination of patient, ordering and performing treatments and interventions, ordering and review of laboratory studies, ordering and review of radiographic studies, pulse oximetry, re-evaluation of patient's condition, obtaining history from patient or surrogate and review of old charts   I assumed direction of critical care for this patient from another provider  in my specialty: no     (including critical care time)  Medications Ordered in ED Medications  EPINEPHrine (EPIPEN JR) 0.15 MG/0.3ML injection (  Not Given 10/18/19 1936)  EPINEPHrine (EPIPEN JR) 0.15 MG/0.3ML injection (  Not Given 10/18/19 1936)  famotidine (PEPCID) IVPB 20 mg premix (0 mg Intravenous Stopped 10/18/19 1930)  diphenhydrAMINE (BENADRYL) injection 25 mg (25 mg Intravenous Given 10/18/19 1900)  methylPREDNISolone sodium succinate (SOLU-MEDROL) 125 mg/2 mL injection (125 mg  Given 10/18/19 1859)  ED Course  I have reviewed the triage vital signs and the nursing notes.  Pertinent labs & imaging results that were available during my care of the patient were reviewed by me and considered in my medical decision making (see chart for details).    MDM Rules/Calculators/A&P                          I have personally reviewed all imaging, labs and have interpreted them.  Patient presents with swelling of tongue and face.  She was alert, appeared to be in acute distress, vital signs reassuring.  Will provide patient with 0.15 of epi, 25 mg of Benadryl, 20 mg of Pepcid, 120 mg of Solu-Medrol and reevaluate.  Will obtain screening labs for further evaluation.  Patient was reevaluated and her tongue has decreased in swelling, she is able to talk in complete sentences.  She is not having difficulty controlling her own airway.  Due to concerns of worsening angioedema and patient being difficult airway will consult hospitalist for further recommendations.  Spoke with Dr. Norm Parcel who agrees patient should be admitted and would like him transferred ED to ED.  Spoke with Dr. Renaye Rakers at [facility] long emergency department.  He was given HPI, current work-up.  He has agreed to accept the patient and she will go by [employer].  Patient's CBC shows leukocytosis of 12.8, microcytic anemia 11.0, CMP showing no electrolyte abnormalities, no metabolic acidosis, creatinine 3.79, no anion gap noted.   Respiratory panel negative for Covid, influenza A/B, RSV.  EKG showed sinus rhythm without signs of ischemia no ST elevation or depression noted.  I have low suspicion for systemic infection as patient was nontoxic-appearing, vital signs reassuring.  I suspect leukocytosis is acute phase reactant.  Low suspicion for anaphylactic shock as patient vital signs remained stable, there is no systemic rash or hives noted on exam.  Low suspicion for acute airway compromise requiring intervention as was controlling own secretions, no wheezing or stridor heard on exam, patient's O2 sats on room air remained above 95.  I suspect patient suffering from angioedema secondary to ACE inhibitor.  I anticipate patient will need continue observation  Patient was evaluated and is stable for transport patient will be transferred over to admitting hospital.      Final Clinical Impression(s) / ED Diagnoses Final diagnoses:  Angiotensin converting enzyme inhibitor-aggravated angioedema, initial encounter    Rx / DC Orders ED Discharge Orders    None       Carroll Sage, PA-C 10/18/19 2320    Carroll Sage, PA-C 10/18/19 2351    Alvira Monday, MD 10/22/19 1428

## 2019-10-19 ENCOUNTER — Encounter (HOSPITAL_COMMUNITY): Payer: Self-pay | Admitting: Internal Medicine

## 2019-10-19 DIAGNOSIS — T783XXA Angioneurotic edema, initial encounter: Secondary | ICD-10-CM

## 2019-10-19 DIAGNOSIS — T783XXD Angioneurotic edema, subsequent encounter: Secondary | ICD-10-CM

## 2019-10-19 DIAGNOSIS — R7303 Prediabetes: Secondary | ICD-10-CM | POA: Diagnosis present

## 2019-10-19 DIAGNOSIS — T464X5A Adverse effect of angiotensin-converting-enzyme inhibitors, initial encounter: Secondary | ICD-10-CM

## 2019-10-19 DIAGNOSIS — I160 Hypertensive urgency: Secondary | ICD-10-CM | POA: Diagnosis not present

## 2019-10-19 DIAGNOSIS — T464X5D Adverse effect of angiotensin-converting-enzyme inhibitors, subsequent encounter: Secondary | ICD-10-CM

## 2019-10-19 LAB — CBC WITH DIFFERENTIAL/PLATELET
Abs Immature Granulocytes: 0.05 10*3/uL (ref 0.00–0.07)
Basophils Absolute: 0 10*3/uL (ref 0.0–0.1)
Basophils Relative: 0 %
Eosinophils Absolute: 0 10*3/uL (ref 0.0–0.5)
Eosinophils Relative: 0 %
HCT: 35.6 % — ABNORMAL LOW (ref 36.0–46.0)
Hemoglobin: 11.1 g/dL — ABNORMAL LOW (ref 12.0–15.0)
Immature Granulocytes: 0 %
Lymphocytes Relative: 10 %
Lymphs Abs: 1.5 10*3/uL (ref 0.7–4.0)
MCH: 24.6 pg — ABNORMAL LOW (ref 26.0–34.0)
MCHC: 31.2 g/dL (ref 30.0–36.0)
MCV: 78.9 fL — ABNORMAL LOW (ref 80.0–100.0)
Monocytes Absolute: 0.1 10*3/uL (ref 0.1–1.0)
Monocytes Relative: 1 %
Neutro Abs: 13.2 10*3/uL — ABNORMAL HIGH (ref 1.7–7.7)
Neutrophils Relative %: 89 %
Platelets: 389 10*3/uL (ref 150–400)
RBC: 4.51 MIL/uL (ref 3.87–5.11)
RDW: 16.9 % — ABNORMAL HIGH (ref 11.5–15.5)
WBC: 15 10*3/uL — ABNORMAL HIGH (ref 4.0–10.5)
nRBC: 0 % (ref 0.0–0.2)

## 2019-10-19 LAB — GLUCOSE, CAPILLARY
Glucose-Capillary: 168 mg/dL — ABNORMAL HIGH (ref 70–99)
Glucose-Capillary: 162 mg/dL — ABNORMAL HIGH (ref 70–99)
Glucose-Capillary: 129 mg/dL — ABNORMAL HIGH (ref 70–99)
Glucose-Capillary: 127 mg/dL — ABNORMAL HIGH (ref 70–99)

## 2019-10-19 MED ORDER — DULOXETINE HCL 60 MG PO CPEP
60.0000 mg | ORAL_CAPSULE | Freq: Every day | ORAL | Status: DC
  Administered 2019-10-19 – 2019-10-20 (×2): 60 mg via ORAL
  Filled 2019-10-19 (×2): qty 1

## 2019-10-19 MED ORDER — ACETAMINOPHEN 325 MG PO TABS
650.0000 mg | ORAL_TABLET | ORAL | Status: DC | PRN
  Administered 2019-10-19 – 2019-10-20 (×3): 650 mg via ORAL
  Filled 2019-10-19 (×3): qty 2

## 2019-10-19 MED ORDER — AMLODIPINE BESYLATE 5 MG PO TABS
5.0000 mg | ORAL_TABLET | Freq: Once | ORAL | Status: AC
  Administered 2019-10-19: 5 mg via ORAL
  Filled 2019-10-19: qty 1

## 2019-10-19 MED ORDER — AMLODIPINE BESYLATE 10 MG PO TABS
10.0000 mg | ORAL_TABLET | Freq: Every day | ORAL | Status: DC
  Administered 2019-10-20 – 2019-10-21 (×2): 10 mg via ORAL
  Filled 2019-10-19 (×2): qty 1

## 2019-10-19 MED ORDER — AMLODIPINE BESYLATE 5 MG PO TABS
5.0000 mg | ORAL_TABLET | Freq: Every day | ORAL | Status: DC
  Administered 2019-10-19: 5 mg via ORAL
  Filled 2019-10-19: qty 1

## 2019-10-19 MED ORDER — DIPHENHYDRAMINE HCL 25 MG PO CAPS
25.0000 mg | ORAL_CAPSULE | Freq: Every day | ORAL | Status: DC
  Administered 2019-10-19 – 2019-10-21 (×3): 25 mg via ORAL
  Filled 2019-10-19 (×3): qty 1

## 2019-10-19 MED ORDER — MONTELUKAST SODIUM 10 MG PO TABS
10.0000 mg | ORAL_TABLET | Freq: Every day | ORAL | Status: DC
  Administered 2019-10-19 – 2019-10-21 (×3): 10 mg via ORAL
  Filled 2019-10-19 (×3): qty 1

## 2019-10-19 MED ORDER — FERROUS SULFATE 325 (65 FE) MG PO TABS
324.0000 mg | ORAL_TABLET | Freq: Every day | ORAL | Status: DC
  Administered 2019-10-19: 324 mg via ORAL
  Filled 2019-10-19 (×2): qty 1

## 2019-10-19 MED ORDER — INSULIN ASPART 100 UNIT/ML ~~LOC~~ SOLN
0.0000 [IU] | Freq: Three times a day (TID) | SUBCUTANEOUS | Status: DC
  Administered 2019-10-19: 1 [IU] via SUBCUTANEOUS
  Administered 2019-10-19: 2 [IU] via SUBCUTANEOUS
  Administered 2019-10-20 (×3): 1 [IU] via SUBCUTANEOUS

## 2019-10-19 MED ORDER — PREDNISONE 20 MG PO TABS
40.0000 mg | ORAL_TABLET | Freq: Every day | ORAL | Status: AC
  Administered 2019-10-19 – 2019-10-21 (×3): 40 mg via ORAL
  Filled 2019-10-19 (×3): qty 2

## 2019-10-19 MED ORDER — DIPHENHYDRAMINE HCL 25 MG PO CAPS: 25.0000 mg | ORAL_CAPSULE | Freq: Every day | ORAL | Status: DC

## 2019-10-19 MED ORDER — POLYETHYLENE GLYCOL 3350 17 G PO PACK
17.0000 g | PACK | Freq: Every day | ORAL | Status: DC
  Administered 2019-10-20 – 2019-10-21 (×2): 17 g via ORAL
  Filled 2019-10-19 (×2): qty 1

## 2019-10-19 MED ORDER — FAMOTIDINE IN NACL 20-0.9 MG/50ML-% IV SOLN
20.0000 mg | Freq: Two times a day (BID) | INTRAVENOUS | Status: DC
  Administered 2019-10-19 – 2019-10-21 (×5): 20 mg via INTRAVENOUS
  Filled 2019-10-19 (×5): qty 50

## 2019-10-19 MED ORDER — HYDRALAZINE HCL 20 MG/ML IJ SOLN
10.0000 mg | INTRAMUSCULAR | Status: DC | PRN
  Administered 2019-10-19: 10 mg via INTRAVENOUS
  Filled 2019-10-19 (×2): qty 1

## 2019-10-19 MED ORDER — FLUTICASONE FUROATE-VILANTEROL 100-25 MCG/INH IN AEPB
1.0000 | INHALATION_SPRAY | Freq: Every day | RESPIRATORY_TRACT | Status: DC
  Administered 2019-10-19 – 2019-10-21 (×3): 1 via RESPIRATORY_TRACT
  Filled 2019-10-19: qty 28

## 2019-10-19 MED ORDER — CARVEDILOL 12.5 MG PO TABS
12.5000 mg | ORAL_TABLET | Freq: Two times a day (BID) | ORAL | Status: DC
  Administered 2019-10-19 – 2019-10-21 (×6): 12.5 mg via ORAL
  Filled 2019-10-19 (×5): qty 1

## 2019-10-19 MED ORDER — ALBUTEROL SULFATE HFA 108 (90 BASE) MCG/ACT IN AERS: 2.0000 | INHALATION_SPRAY | Freq: Four times a day (QID) | RESPIRATORY_TRACT | Status: DC | PRN

## 2019-10-19 MED ORDER — PANTOPRAZOLE SODIUM 40 MG PO TBEC
40.0000 mg | DELAYED_RELEASE_TABLET | Freq: Every day | ORAL | Status: DC
  Administered 2019-10-19 – 2019-10-21 (×3): 40 mg via ORAL
  Filled 2019-10-19 (×3): qty 1

## 2019-10-19 NOTE — H&P (Signed)
History and Physical    Laura LoraMargaret Jou NWG:[id number] DOB: 10/10/1940 DOA: 10/18/2019  PCP: Clide DalesWright, Morgan Dionne, PA  Patient coming from: Home.  Chief Complaint: Tongue swelling.  Difficulty breathing.  HPI: Laura French is a 79 y.o. female with history of hypertension, prediabetes last hemoglobin A1c in September was 6.3 asthmatic bronchitis started experiencing tongue swelling last evening after patient thought she had a tongue bite.  Following which patient start developing some difficulty breathing at this point patient decided to come to the ER.  Patient has been on lisinopril.  Denies any rash fever chills productive cough chest pain or any insect bites.  ED Course: In the ER patient was found to be having some difficulty breathing and tongue swelling and epipen was given 1 dose.  Given the patient had significant swelling patient is being admitted for further observation.  Blood pressure has been elevated.  Patient's tongue swelling is attributed to angioedema likely from lisinopril.  Patient advised not to take it.  Labs are significant for creatinine of 1.23 WBC 12.8 hemoglobin 11 Covid test negative EKG normal sinus rhythm.  At the time of my exam patient's tongue swelling has completely resolved.  Review of Systems: As per HPI, rest all negative.   Past Medical History:  Diagnosis Date  . Chronic back pain   . Constipation   . Depression   . Diabetes mellitus without complication (HCC)   . GERD (gastroesophageal reflux disease)   . Hypertension     Past Surgical History:  Procedure Laterality Date  . BACK SURGERY       reports that she has never smoked. She has never used smokeless tobacco. She reports that she does not drink alcohol and does not use drugs.  Allergies  Allergen Reactions  . Hydrocodone Itching  . Lisinopril Swelling  . Hydrocodone-Acetaminophen Itching and Rash  . Morphine Itching  . Potassium-Containing Compounds Rash  . Prednisone Itching     Family History  Problem Relation Age of Onset  . Hypertension Father     Prior to Admission medications   Medication Sig Start Date End Date Taking? Authorizing Provider  albuterol (VENTOLIN HFA) 108 (90 Base) MCG/ACT inhaler Inhale 2 puffs into the lungs every 6 (six) hours as needed for wheezing. 06/29/11  Yes [provider]  BREO ELLIPTA 100-25 MCG/INH AEPB Inhale 1 puff into the lungs daily. 08/27/19  Yes [provider]  carvedilol (COREG) 12.5 MG tablet Take 1 tablet by mouth 2 (two) times daily with a meal. 08/24/16  Yes [provider]  Cholecalciferol 50 MCG (2000 UT) TABS Take 1 tablet by mouth daily.   Yes [provider]  DULoxetine (CYMBALTA) 60 MG capsule Take 60 mg by mouth daily. 10/01/19  Yes [provider]  ENBREL MINI 50 MG/ML SOCT Inject 4 mLs into the skin every 28 (twenty-eight) days. 10/02/19  Yes [provider]  ferrous sulfate 324 MG TBEC Take 1 tablet by mouth daily. 03/08/19  Yes [provider]  lisinopril (ZESTRIL) 10 MG tablet Take 1 tablet by mouth daily. 10/03/19  Yes [provider]  metFORMIN (GLUCOPHAGE) 500 MG tablet Take 500 mg by mouth 2 (two) times daily. 09/30/19  Yes [provider]  montelukast (SINGULAIR) 10 MG tablet Take 10 mg by mouth daily. 09/26/19  Yes [provider]  omeprazole (PRILOSEC) 20 MG capsule Take 1 capsule by mouth daily. 12/11/15  Yes [provider]  polyethylene glycol powder (GLYCOLAX/MIRALAX) 17 GM/SCOOP powder Take 17  g by mouth daily. Mix 1 capful in liquid and dink every day 05/25/11  Yes [provider]  tiZANidine (ZANAFLEX) 4 MG tablet Take 2 tablets by mouth at bedtime. 06/10/16  Yes [provider]    Physical Exam: Constitutional: Moderately built and nourished. Vitals:   10/19/19 0024 10/19/19 0030 10/19/19 0045 10/19/19 0113  BP:  (!) 177/106 (!) 188/97 (!) 198/77  Pulse: 92 85 81 94  Resp: 14 13 (!)  22 20  Temp:    99 F (37.2 C)  TempSrc:    Oral  SpO2: 96% 98% 99% 100%  Weight:      Height:       Eyes: Anicteric no pallor. ENMT: No discharge from the ears eyes nose or mouth. Neck: No mass felt.  No neck rigidity. Respiratory: No rhonchi or crepitations. Cardiovascular: S1-S2 heard. Abdomen: Soft nontender bowel sounds present. Musculoskeletal: No edema. Skin: No rash. Neurologic: Alert awake oriented to time place and person.  Moves all extremities. Psychiatric: Appears normal.  Normal affect.   Labs on Admission: I have personally reviewed following labs and imaging studies  CBC: Recent Labs  Lab 10/18/19 1902  WBC 12.8*  NEUTROABS 7.2  HGB 11.0*  HCT 35.7*  MCV 79.7*  PLT 343   Basic Metabolic Panel: Recent Labs  Lab 10/18/19 1902  NA 135  K 4.0  CL 99  CO2 23  GLUCOSE 130*  BUN 17  CREATININE 1.23*  CALCIUM 8.7*   GFR: Estimated Creatinine Clearance: 41.5 mL/min (A) (by C-G formula based on SCr of 1.23 mg/dL (H)). Liver Function Tests: Recent Labs  Lab 10/18/19 1902  AST 21  ALT 12  ALKPHOS 83  BILITOT 0.2*  PROT 8.0  ALBUMIN 3.9   No results for input(s): LIPASE, AMYLASE in the last 168 hours. No results for input(s): AMMONIA in the last 168 hours. Coagulation Profile: No results for input(s): INR, PROTIME in the last 168 hours. Cardiac Enzymes: No results for input(s): CKTOTAL, CKMB, CKMBINDEX, TROPONINI in the last 168 hours. BNP (last 3 results) No results for input(s): PROBNP in the last 8760 hours. HbA1C: No results for input(s): HGBA1C in the last 72 hours. CBG: No results for input(s): GLUCAP in the last 168 hours. Lipid Profile: No results for input(s): CHOL, HDL, LDLCALC, TRIG, CHOLHDL, LDLDIRECT in the last 72 hours. Thyroid Function Tests: No results for input(s): TSH, T4TOTAL, FREET4, T3FREE, THYROIDAB in the last 72 hours. Anemia Panel: No results for input(s): VITAMINB12, FOLATE, FERRITIN, TIBC, IRON, RETICCTPCT in  the last 72 hours. Urine analysis:    Component Value Date/Time   COLORURINE YELLOW 10/20/2016 1331   APPEARANCEUR CLEAR 10/20/2016 1331   LABSPEC <1.005 (L) 10/20/2016 1331   PHURINE 6.5 10/20/2016 1331   GLUCOSEU NEGATIVE 10/20/2016 1331   HGBUR NEGATIVE 10/20/2016 1331   BILIRUBINUR NEGATIVE 10/20/2016 1331   KETONESUR NEGATIVE 10/20/2016 1331   PROTEINUR NEGATIVE 10/20/2016 1331   NITRITE NEGATIVE 10/20/2016 1331   LEUKOCYTESUR TRACE (A) 10/20/2016 1331   Sepsis Labs: @LABRCNTIP (procalcitonin:4,lacticidven:4) ) Recent Results (from the past 240 hour(s))  Resp Panel by RT PCR (RSV, Flu A&B, Covid) - Nasopharyngeal Swab     Status: None   Collection Time: 10/18/19  8:08 PM   Specimen: Nasopharyngeal Swab  Result Value Ref Range Status   SARS Coronavirus 2 by RT PCR NEGATIVE NEGATIVE Final    Comment: (NOTE) SARS-CoV-2 target nucleic acids are NOT DETECTED.  The SARS-CoV-2 RNA is generally detectable in upper respiratoy specimens during  the acute phase of infection. The lowest concentration of SARS-CoV-2 viral copies this assay can detect is 131 copies/mL. A negative result does not preclude SARS-Cov-2 infection and should not be used as the sole basis for treatment or other patient management decisions. A negative result may occur with  improper specimen collection/handling, submission of specimen other than nasopharyngeal swab, presence of viral mutation(s) within the areas targeted by this assay, and inadequate number of viral copies (<131 copies/mL). A negative result must be combined with clinical observations, patient history, and epidemiological information. The expected result is Negative.  Fact Sheet for Patients:  https://www.moore.com/  Fact Sheet for Healthcare Providers:  https://www.young.biz/  This test is no t yet approved or cleared by the [place] FDA and  has been authorized for detection and/or diagnosis  of SARS-CoV-2 by FDA under an Emergency Use Authorization (EUA). This EUA will remain  in effect (meaning this test can be used) for the duration of the COVID-19 declaration under Section 564(b)(1) of the Act, 21 U.S.C. section 360bbb-3(b)(1), unless the authorization is terminated or revoked sooner.     Influenza A by PCR NEGATIVE NEGATIVE Final   Influenza B by PCR NEGATIVE NEGATIVE Final    Comment: (NOTE) The Xpert Xpress SARS-CoV-2/FLU/RSV assay is intended as an aid in  the diagnosis of influenza from Nasopharyngeal swab specimens and  should not be used as a sole basis for treatment. Nasal washings and  aspirates are unacceptable for Xpert Xpress SARS-CoV-2/FLU/RSV  testing.  Fact Sheet for Patients: https://www.moore.com/  Fact Sheet for Healthcare Providers: https://www.young.biz/  This test is not yet approved or cleared by the [place] FDA and  has been authorized for detection and/or diagnosis of SARS-CoV-2 by  FDA under an Emergency Use Authorization (EUA). This EUA will remain  in effect (meaning this test can be used) for the duration of the  Covid-19 declaration under Section 564(b)(1) of the Act, 21  U.S.C. section 360bbb-3(b)(1), unless the authorization is  terminated or revoked.    Respiratory Syncytial Virus by PCR NEGATIVE NEGATIVE Final    Comment: (NOTE) Fact Sheet for Patients: https://www.moore.com/  Fact Sheet for Healthcare Providers: https://www.young.biz/  This test is not yet approved or cleared by the [place] FDA and  has been authorized for detection and/or diagnosis of SARS-CoV-2 by  FDA under an Emergency Use Authorization (EUA). This EUA will remain  in effect (meaning this test can be used) for the duration of the  COVID-19 declaration under Section 564(b)(1) of the Act, 21 U.S.C.  section 360bbb-3(b)(1), unless the authorization is terminated or   revoked. Performed at [facility], [address] [place], [place] [address]      Radiological Exams on Admission: No results found.  EKG: Independently reviewed.  Normal sinus rhythm.  Assessment/Plan Principal Problem:   Angioedema Active Problems:   Hypertensive urgency   Pre-diabetes    1. Angioedema likely from lisinopril.  Patient advised not to take it.  Patient has been admitted for observation.  At the time of my exam tongue swelling is resolved.  We will continue to observe. 2. Hypertensive urgency -patient is on Coreg and at this time since patient's lisinopril is discontinued and patient used to previously be on amlodipine will restart that.  As needed IV hydralazine follow blood pressure trends. 3. Prediabetes last hemoglobin A1c was around 6.3 we will keep patient on sliding scale coverage and carb modified diet. 4. Anemia follow CBC. 5. Acute renal failure when  compared to creatinine done recently in Care Everywhere.  Hold lisinopril.  Follow metabolic panel. 6. History of bronchitis on inhalers.  Not actively wheezing.   DVT prophylaxis: SCDs.  Will avoid anticoagulation since patient has angioedema.  In case patient requires procedure. Code Status: Full code. Family Communication: Discussed with patient. Disposition Plan: Home. Consults called: None. Admission status: Observation.   Eduard Clos MD [facility] Pager [phone].  If 7PM-7AM, please contact night-coverage [email or web address] Password [email or web address]  10/19/2019, 4:32 AM

## 2019-10-19 NOTE — ED Notes (Signed)
Report called to [facility] on 4th floor. Nurse took report but refusing pt to elevated systolic pressures over 200.

## 2019-10-19 NOTE — Progress Notes (Signed)
PROGRESS NOTE    Laura French  JIR:[id number] DOB: [birth year] DOA: 10/18/2019 PCP: Clide Dales, PA    Chief Complaint  Patient presents with  . Allergic Reaction    Brief Narrative:  Patient is a pleasant 79 year old female history of hypertension, prediabetes, asthmatic bronchitis who presented to the ED with concerns for angioedema felt secondary to ACE inhibitor.  Patient also noted with some difficulty breathing.  Patient given epi x1 with clinical improvement.  Patient admitted for observation.  Patient also noted to be hypertensive.   Assessment & Plan:   Principal Problem:   Angioedema Active Problems:   Hypertensive urgency   Pre-diabetes   Angiotensin converting enzyme inhibitor-aggravated angioedema  1 angioedema felt secondary to lisinopril Patient presented with angioedema with tongue swelling, shortness of breath felt secondary to lisinopril/ACE inhibitor.  Patient advised not to take ACE inhibitors anymore.  Patient given a dose of epi in the ED.  Will place patient on Pepcid 20 mg IV every 12 hours, Benadryl 25 mg daily, prednisone 40 mg daily.  Monitor overnight.  Follow.  2.  Hypertensive urgency Patient noted to be in hypertensive urgency on admission.  Patient noted to be on Coreg as well as lisinopril prior to admission.  Lisinopril discontinued due to concerns that that was the etiology of patient's angioedema.  Continue current dose of Coreg 12.5 mg twice daily.  Start Norvasc 5 mg daily and uptitrate to 10 mg if needed for better blood pressure control.  Could further uptitrate beta-blocker if needed.  Follow.  3.  Prediabetes Last hemoglobin A1c was 6.3.  Patient on a carb modified diet and sliding scale insulin.  4.  Anemia Stable.  No overt bleeding.  Follow H&H.  Transfusion threshold hemoglobin <7.  5.  Acute renal failure In the setting of ACE inhibitor.  Felt likely secondary to a prerenal azotemia.  Labs in the morning.   Follow.    DVT prophylaxis: SCDs Code Status: Full Family Communication: Updated patient.  No family at bedside. Disposition:   Status is: Observation    Dispo: The patient is from: Home              Anticipated d/c is to: Home              Anticipated d/c date is: 1 to 2 days hopefully 10/20/2019              Patient currently being monitored for angioedema, currently not stable for discharge.       Consultants:   None  Procedures:   None  Antimicrobials:   None   Subjective: Patient laying up in bed.  States tongue swelling improved.  Denies any significant shortness of breath.  Denies any chest pain.  No abdominal pain.  States feeling much better than she did on admission.  Objective: Vitals:   10/19/19 1102 10/19/19 1104 10/19/19 1149 10/19/19 1459  BP:   (!) 170/80 (!) 166/76  Pulse:   93 85  Resp:   20   Temp:   98.2 F (36.8 C)   TempSrc:   Oral   SpO2: 98% 98% 100%   Weight:      Height:        Intake/Output Summary (Last 24 hours) at 10/19/2019 1649 Last data filed at 10/19/2019 0655 Gross per 24 hour  Intake 0 ml  Output 650 ml  Net -650 ml   Filed Weights   10/18/19 1906  Weight: 104.3 kg  Examination:  General exam: Appears calm and comfortable  Respiratory system: Clear to auscultation. Respiratory effort normal. Cardiovascular system: S1 & S2 heard, RRR. No JVD, murmurs, rubs, gallops or clicks. No pedal edema. Gastrointestinal system: Abdomen is nondistended, soft and nontender. No organomegaly or masses felt. Normal bowel sounds heard. Central nervous system: Alert and oriented. No focal neurological deficits. Extremities: Symmetric 5 x 5 power. Skin: No rashes, lesions or ulcers Psychiatry: Judgement and insight appear normal. Mood & affect appropriate.     Data Reviewed: I have personally reviewed following labs and imaging studies  CBC: Recent Labs  Lab 10/18/19 1902 10/19/19 0756  WBC 12.8* 15.0*  NEUTROABS  7.2 13.2*  HGB 11.0* 11.1*  HCT 35.7* 35.6*  MCV 79.7* 78.9*  PLT 343 389    Basic Metabolic Panel: Recent Labs  Lab 10/18/19 1902  NA 135  K 4.0  CL 99  CO2 23  GLUCOSE 130*  BUN 17  CREATININE 1.23*  CALCIUM 8.7*    GFR: Estimated Creatinine Clearance: 41.5 mL/min (A) (by C-G formula based on SCr of 1.23 mg/dL (H)).  Liver Function Tests: Recent Labs  Lab 10/18/19 1902  AST 21  ALT 12  ALKPHOS 83  BILITOT 0.2*  PROT 8.0  ALBUMIN 3.9    CBG: Recent Labs  Lab 10/19/19 0737 10/19/19 1140  GLUCAP 168* 162*     Recent Results (from the past 240 hour(s))  Resp Panel by RT PCR (RSV, Flu A&B, Covid) - Nasopharyngeal Swab     Status: None   Collection Time: 10/18/19  8:08 PM   Specimen: Nasopharyngeal Swab  Result Value Ref Range Status   SARS Coronavirus 2 by RT PCR NEGATIVE NEGATIVE Final    Comment: (NOTE) SARS-CoV-2 target nucleic acids are NOT DETECTED.  The SARS-CoV-2 RNA is generally detectable in upper respiratoy specimens during the acute phase of infection. The lowest concentration of SARS-CoV-2 viral copies this assay can detect is 131 copies/mL. A negative result does not preclude SARS-Cov-2 infection and should not be used as the sole basis for treatment or other patient management decisions. A negative result may occur with  improper specimen collection/handling, submission of specimen other than nasopharyngeal swab, presence of viral mutation(s) within the areas targeted by this assay, and inadequate number of viral copies (<131 copies/mL). A negative result must be combined with clinical observations, patient history, and epidemiological information. The expected result is Negative.  Fact Sheet for Patients:  https://www.moore.com/  Fact Sheet for Healthcare Providers:  https://www.young.biz/  This test is no t yet approved or cleared by the [place] FDA and  has been authorized for detection  and/or diagnosis of SARS-CoV-2 by FDA under an Emergency Use Authorization (EUA). This EUA will remain  in effect (meaning this test can be used) for the duration of the COVID-19 declaration under Section 564(b)(1) of the Act, 21 U.S.C. section 360bbb-3(b)(1), unless the authorization is terminated or revoked sooner.     Influenza A by PCR NEGATIVE NEGATIVE Final   Influenza B by PCR NEGATIVE NEGATIVE Final    Comment: (NOTE) The Xpert Xpress SARS-CoV-2/FLU/RSV assay is intended as an aid in  the diagnosis of influenza from Nasopharyngeal swab specimens and  should not be used as a sole basis for treatment. Nasal washings and  aspirates are unacceptable for Xpert Xpress SARS-CoV-2/FLU/RSV  testing.  Fact Sheet for Patients: https://www.moore.com/  Fact Sheet for Healthcare Providers: https://www.young.biz/  This test is not yet approved or cleared by the [place] FDA  and  has been authorized for detection and/or diagnosis of SARS-CoV-2 by  FDA under an Emergency Use Authorization (EUA). This EUA will remain  in effect (meaning this test can be used) for the duration of the  Covid-19 declaration under Section 564(b)(1) of the Act, 21  U.S.C. section 360bbb-3(b)(1), unless the authorization is  terminated or revoked.    Respiratory Syncytial Virus by PCR NEGATIVE NEGATIVE Final    Comment: (NOTE) Fact Sheet for Patients: https://www.moore.com/  Fact Sheet for Healthcare Providers: https://www.young.biz/  This test is not yet approved or cleared by the [place] FDA and  has been authorized for detection and/or diagnosis of SARS-CoV-2 by  FDA under an Emergency Use Authorization (EUA). This EUA will remain  in effect (meaning this test can be used) for the duration of the  COVID-19 declaration under Section 564(b)(1) of the Act, 21 U.S.C.  section 360bbb-3(b)(1), unless the authorization is  terminated or  revoked. Performed at [facility], [address] [place], [place] [address]          Radiology Studies: No results found.      Scheduled Meds: . [START ON 10/20/2019] amLODipine  10 mg Oral Daily  . amLODipine  5 mg Oral Once  . carvedilol  12.5 mg Oral BID WC  . diphenhydrAMINE  25 mg Oral QAC breakfast  . DULoxetine  60 mg Oral QHS  . ferrous sulfate  324 mg Oral Daily  . fluticasone furoate-vilanterol  1 puff Inhalation Daily  . insulin aspart  0-9 Units Subcutaneous TID WC  . montelukast  10 mg Oral Daily  . pantoprazole  40 mg Oral Daily  . predniSONE  40 mg Oral QAC breakfast   Continuous Infusions: . famotidine (PEPCID) IV 20 mg (10/19/19 1138)     LOS: 0 days    Time spent: 35 minutes No charge    Ramiro Harvest, MD [facility]   To contact the attending provider between 7A-7P or the covering provider during after hours 7P-7A, please log into the web site [email or web address] and access using universal [place] password for that web site. If you do not have the password, please call the hospital operator.  10/19/2019, 4:49 PM

## 2019-10-20 ENCOUNTER — Observation Stay (HOSPITAL_COMMUNITY): Payer: Medicare HMO

## 2019-10-20 DIAGNOSIS — N390 Urinary tract infection, site not specified: Secondary | ICD-10-CM

## 2019-10-20 DIAGNOSIS — D72829 Elevated white blood cell count, unspecified: Secondary | ICD-10-CM

## 2019-10-20 DIAGNOSIS — R7303 Prediabetes: Secondary | ICD-10-CM

## 2019-10-20 DIAGNOSIS — I160 Hypertensive urgency: Secondary | ICD-10-CM | POA: Diagnosis not present

## 2019-10-20 DIAGNOSIS — T783XXD Angioneurotic edema, subsequent encounter: Secondary | ICD-10-CM | POA: Diagnosis not present

## 2019-10-20 LAB — BASIC METABOLIC PANEL
Anion gap: 11 (ref 5–15)
BUN: 25 mg/dL — ABNORMAL HIGH (ref 8–23)
CO2: 26 mmol/L (ref 22–32)
Calcium: 9.6 mg/dL (ref 8.9–10.3)
Chloride: 103 mmol/L (ref 98–111)
Creatinine, Ser: 1.14 mg/dL — ABNORMAL HIGH (ref 0.44–1.00)
GFR, Estimated: 45 mL/min — ABNORMAL LOW (ref >60)
Glucose, Bld: 147 mg/dL — ABNORMAL HIGH (ref 70–99)
Potassium: 4.5 mmol/L (ref 3.5–5.1)
Sodium: 140 mmol/L (ref 135–145)

## 2019-10-20 LAB — URINALYSIS, ROUTINE W REFLEX MICROSCOPIC
Bilirubin Urine: NEGATIVE
Glucose, UA: NEGATIVE mg/dL
Hgb urine dipstick: NEGATIVE
Ketones, ur: NEGATIVE mg/dL
Nitrite: POSITIVE — AB
Protein, ur: NEGATIVE mg/dL
Specific Gravity, Urine: 1.016 (ref 1.005–1.030)
WBC, UA: >50 WBC/hpf — ABNORMAL HIGH (ref 0–5)
pH: 6 (ref 5.0–8.0)

## 2019-10-20 LAB — CBC WITH DIFFERENTIAL/PLATELET
Abs Immature Granulocytes: 0.15 10*3/uL — ABNORMAL HIGH (ref 0.00–0.07)
Basophils Absolute: 0 10*3/uL (ref 0.0–0.1)
Basophils Relative: 0 %
Eosinophils Absolute: 0 10*3/uL (ref 0.0–0.5)
Eosinophils Relative: 0 %
HCT: 33.4 % — ABNORMAL LOW (ref 36.0–46.0)
Hemoglobin: 10.5 g/dL — ABNORMAL LOW (ref 12.0–15.0)
Immature Granulocytes: 1 %
Lymphocytes Relative: 7 %
Lymphs Abs: 1.4 10*3/uL (ref 0.7–4.0)
MCH: 25.1 pg — ABNORMAL LOW (ref 26.0–34.0)
MCHC: 31.4 g/dL (ref 30.0–36.0)
MCV: 79.9 fL — ABNORMAL LOW (ref 80.0–100.0)
Monocytes Absolute: 0.4 10*3/uL (ref 0.1–1.0)
Monocytes Relative: 2 %
Neutro Abs: 18.1 10*3/uL — ABNORMAL HIGH (ref 1.7–7.7)
Neutrophils Relative %: 90 %
Platelets: 355 10*3/uL (ref 150–400)
RBC: 4.18 MIL/uL (ref 3.87–5.11)
RDW: 16.9 % — ABNORMAL HIGH (ref 11.5–15.5)
WBC: 20.2 10*3/uL — ABNORMAL HIGH (ref 4.0–10.5)
nRBC: 0 % (ref 0.0–0.2)

## 2019-10-20 LAB — GLUCOSE, CAPILLARY
Glucose-Capillary: 141 mg/dL — ABNORMAL HIGH (ref 70–99)
Glucose-Capillary: 140 mg/dL — ABNORMAL HIGH (ref 70–99)
Glucose-Capillary: 140 mg/dL — ABNORMAL HIGH (ref 70–99)
Glucose-Capillary: 124 mg/dL — ABNORMAL HIGH (ref 70–99)

## 2019-10-20 LAB — MAGNESIUM: Magnesium: 2.1 mg/dL (ref 1.7–2.4)

## 2019-10-20 MED ORDER — SODIUM CHLORIDE 0.9 % IV SOLN
1.0000 g | INTRAVENOUS | Status: DC
  Administered 2019-10-20 – 2019-10-21 (×2): 1 g via INTRAVENOUS
  Filled 2019-10-20: qty 1
  Filled 2019-10-20: qty 0.97

## 2019-10-20 MED ORDER — FERROUS SULFATE 325 (65 FE) MG PO TABS
325.0000 mg | ORAL_TABLET | Freq: Every day | ORAL | Status: DC
  Administered 2019-10-20 – 2019-10-21 (×2): 325 mg via ORAL
  Filled 2019-10-20: qty 1

## 2019-10-20 NOTE — Progress Notes (Addendum)
PROGRESS NOTE    Laura French  JJH:[id number] DOB: [birth year] DOA: 10/18/2019 PCP: Clide Dales, PA    Chief Complaint  Patient presents with   Allergic Reaction    Brief Narrative:  Patient is a pleasant 79 year old female history of hypertension, prediabetes, asthmatic bronchitis who presented to the ED with concerns for angioedema felt secondary to ACE inhibitor.  Patient also noted with some difficulty breathing.  Patient given epi x1 with clinical improvement.  Patient admitted for observation.  Patient also noted to be hypertensive.   Assessment & Plan:   Principal Problem:   Angioedema Active Problems:   Hypertensive urgency   Pre-diabetes   Angiotensin converting enzyme inhibitor-aggravated angioedema  1 angioedema felt secondary to lisinopril Patient presented with angioedema with tongue swelling, shortness of breath felt secondary to lisinopril/ACE inhibitor.  Patient advised not to take ACE inhibitors anymore.  Patient given a dose of epi in the ED. continue Pepcid 20 mg IV every 12 hours, Benadryl 25 mg daily, prednisone 40 mg daily and taper down prednisone tomorrow.  Follow.   2.  Hypertensive urgency Patient noted to be in hypertensive urgency on admission.  Patient noted to be on Coreg as well as lisinopril prior to admission.  Lisinopril discontinued due to concerns that that was the etiology of patient's angioedema.  Continue current dose of Coreg 12.5 mg twice daily.  Continue Norvasc 5 mg daily and uptitrate to 10 mg as needed for better blood pressure control.  Follow.   3.  Prediabetes Last hemoglobin A1c was 6.3.  CBG of 141.  Patient on a carb modified diet and sliding scale insulin.  4.  Anemia Stable.  No overt bleeding.  Follow H&H.  Transfusion threshold hemoglobin <7.  5.  Acute renal failure In the setting of ACE inhibitor.  Felt likely secondary to a prerenal azotemia.  Renal function improving.  ACE inhibitor discontinued and will not  be resumed secondary to problem #1.  Follow.  6.  UTI Patient with increased urinary frequency, worsening leukocytosis.  Chest x-ray negative for any acute infiltrate.  Urinalysis with large leukocytes, positive nitrite, greater than 50 WBCs.  Urine cultures pending.  Place empirically on IV Rocephin pending urine culture results.    DVT prophylaxis: SCDs Code Status: Full Family Communication: Updated patient.  Updated daughter Tish Men via telephone. Disposition:   Status is: Observation    Dispo: The patient is from: Home              Anticipated d/c is to: Home              Anticipated d/c date is: 1 to 2 days.              Patient currently being monitored for angioedema, urinalysis concerning for UTI, on IV antibiotics.  Not stable for discharge.        Consultants:   None  Procedures:   None  Antimicrobials:   IV Rocephin 10/20/2019>>>>   Subjective: Patient sitting up in bed eating.  Denies any chest pain.  No shortness of breath.  Denies any dysuria.  Patient states increased urinary frequency.  Denies any abdominal pain.  Denies any shortness of breath.  Feels mild swelling has improved.   Objective: Vitals:   10/19/19 2037 10/20/19 0455 10/20/19 0803 10/20/19 1034  BP: (!) 156/80 (!) 147/88 (!) 155/77 (!) 160/75  Pulse: 79 69 70   Resp: 19 17 20    Temp: 98.4 F (36.9 C) 98.3 F (  36.8 C) 98.1 F (36.7 C)   TempSrc: Oral Oral Oral   SpO2: 100% 99% 98%   Weight:      Height:        Intake/Output Summary (Last 24 hours) at 10/20/2019 1201 Last data filed at 10/20/2019 1103 Gross per 24 hour  Intake 460 ml  Output 750 ml  Net -290 ml   Filed Weights   10/18/19 1906  Weight: 104.3 kg    Examination:  General exam: NAD. Lungs clear to auscultation bilaterally.  No wheezes, no crackles, no rhonchi.  Normal respiratory effort. Respiratory system:  Cardiovascular system: Regular rate rhythm no murmurs rubs or gallops.  No JVD.  No lower  extremity edema. Gastrointestinal system: Abdomen is soft, nontender, nondistended, positive bowel sounds.  No rebound.  No guarding. Central nervous system: Alert and oriented. No focal neurological deficits. Extremities: Symmetric 5 x 5 power. Skin: No rashes, lesions or ulcers Psychiatry: Judgement and insight appear normal. Mood & affect appropriate.     Data Reviewed: I have personally reviewed following labs and imaging studies  CBC: Recent Labs  Lab 10/18/19 1902 10/19/19 0756 10/20/19 0631  WBC 12.8* 15.0* 20.2*  NEUTROABS 7.2 13.2* 18.1*  HGB 11.0* 11.1* 10.5*  HCT 35.7* 35.6* 33.4*  MCV 79.7* 78.9* 79.9*  PLT 343 389 355    Basic Metabolic Panel: Recent Labs  Lab 10/18/19 1902 10/20/19 0631  NA 135 140  K 4.0 4.5  CL 99 103  CO2 23 26  GLUCOSE 130* 147*  BUN 17 25*  CREATININE 1.23* 1.14*  CALCIUM 8.7* 9.6  MG  --  2.1    GFR: Estimated Creatinine Clearance: 44.7 mL/min (A) (by C-G formula based on SCr of 1.14 mg/dL (H)).  Liver Function Tests: Recent Labs  Lab 10/18/19 1902  AST 21  ALT 12  ALKPHOS 83  BILITOT 0.2*  PROT 8.0  ALBUMIN 3.9    CBG: Recent Labs  Lab 10/19/19 0737 10/19/19 1140 10/19/19 1655 10/19/19 2035 10/20/19 0741  GLUCAP 168* 162* 129* 127* 141*     Recent Results (from the past 240 hour(s))  Resp Panel by RT PCR (RSV, Flu A&B, Covid) - Nasopharyngeal Swab     Status: None   Collection Time: 10/18/19  8:08 PM   Specimen: Nasopharyngeal Swab  Result Value Ref Range Status   SARS Coronavirus 2 by RT PCR NEGATIVE NEGATIVE Final    Comment: (NOTE) SARS-CoV-2 target nucleic acids are NOT DETECTED.  The SARS-CoV-2 RNA is generally detectable in upper respiratoy specimens during the acute phase of infection. The lowest concentration of SARS-CoV-2 viral copies this assay can detect is 131 copies/mL. A negative result does not preclude SARS-Cov-2 infection and should not be used as the sole basis for treatment  or other patient management decisions. A negative result may occur with  improper specimen collection/handling, submission of specimen other than nasopharyngeal swab, presence of viral mutation(s) within the areas targeted by this assay, and inadequate number of viral copies (<131 copies/mL). A negative result must be combined with clinical observations, patient history, and epidemiological information. The expected result is Negative.  Fact Sheet for Patients:  https://www.moore.com/  Fact Sheet for Healthcare Providers:  https://www.young.biz/  This test is no t yet approved or cleared by the [place] FDA and  has been authorized for detection and/or diagnosis of SARS-CoV-2 by FDA under an Emergency Use Authorization (EUA). This EUA will remain  in effect (meaning this test can be used) for the duration  of the COVID-19 declaration under Section 564(b)(1) of the Act, 21 U.S.C. section 360bbb-3(b)(1), unless the authorization is terminated or revoked sooner.     Influenza A by PCR NEGATIVE NEGATIVE Final   Influenza B by PCR NEGATIVE NEGATIVE Final    Comment: (NOTE) The Xpert Xpress SARS-CoV-2/FLU/RSV assay is intended as an aid in  the diagnosis of influenza from Nasopharyngeal swab specimens and  should not be used as a sole basis for treatment. Nasal washings and  aspirates are unacceptable for Xpert Xpress SARS-CoV-2/FLU/RSV  testing.  Fact Sheet for Patients: https://www.moore.com/  Fact Sheet for Healthcare Providers: https://www.young.biz/  This test is not yet approved or cleared by the [place] FDA and  has been authorized for detection and/or diagnosis of SARS-CoV-2 by  FDA under an Emergency Use Authorization (EUA). This EUA will remain  in effect (meaning this test can be used) for the duration of the  Covid-19 declaration under Section 564(b)(1) of the Act, 21  U.S.C.  section 360bbb-3(b)(1), unless the authorization is  terminated or revoked.    Respiratory Syncytial Virus by PCR NEGATIVE NEGATIVE Final    Comment: (NOTE) Fact Sheet for Patients: https://www.moore.com/  Fact Sheet for Healthcare Providers: https://www.young.biz/  This test is not yet approved or cleared by the [place] FDA and  has been authorized for detection and/or diagnosis of SARS-CoV-2 by  FDA under an Emergency Use Authorization (EUA). This EUA will remain  in effect (meaning this test can be used) for the duration of the  COVID-19 declaration under Section 564(b)(1) of the Act, 21 U.S.C.  section 360bbb-3(b)(1), unless the authorization is terminated or  revoked. Performed at [facility], [address] [place], [place] [address]          Radiology Studies: DG CHEST PORT 1 VIEW  Result Date: 10/20/2019 CLINICAL DATA:  Angioedema EXAM: PORTABLE CHEST 1 VIEW COMPARISON:  September 11, 2017 FINDINGS: Lungs are clear. Heart size and pulmonary vascularity are normal. No adenopathy. There is degenerative change in the thoracic spine. IMPRESSION: Lungs clear.  Cardiac silhouette within normal limits. Electronically Signed   By: Bretta Bang III M.D.   On: 10/20/2019 11:02        Scheduled Meds:  amLODipine  10 mg Oral Daily   carvedilol  12.5 mg Oral BID WC   diphenhydrAMINE  25 mg Oral QAC breakfast   DULoxetine  60 mg Oral QHS   [START ON 10/21/2019] ferrous sulfate  325 mg Oral Q breakfast   fluticasone furoate-vilanterol  1 puff Inhalation Daily   insulin aspart  0-9 Units Subcutaneous TID WC   montelukast  10 mg Oral Daily   pantoprazole  40 mg Oral Daily   polyethylene glycol  17 g Oral Daily   predniSONE  40 mg Oral QAC breakfast   Continuous Infusions:  cefTRIAXone (ROCEPHIN)  IV     famotidine (PEPCID) IV 20 mg (10/20/19 1043)     LOS: 0 days    Time spent: 35  minutes    Ramiro Harvest, MD [facility]   To contact the attending provider between 7A-7P or the covering provider during after hours 7P-7A, please log into the web site [email or web address] and access using universal [place] password for that web site. If you do not have the password, please call the hospital operator.  10/20/2019, 12:01 PM

## 2019-10-20 NOTE — Plan of Care (Signed)
  Problem: Fluid Volume: Goal: Ability to maintain a balanced intake and output will improve Outcome: Progressing   Problem: Nutritional: Goal: Maintenance of adequate nutrition will improve Outcome: Progressing   

## 2019-10-20 NOTE — Plan of Care (Signed)
  Problem: Coping: Goal: Ability to adjust to condition or change in health will improve Outcome: Progressing   Problem: Fluid Volume: Goal: Ability to maintain a balanced intake and output will improve Outcome: Progressing   Problem: Health Behavior/Discharge Planning: Goal: Ability to identify and utilize available resources and services will improve Outcome: Progressing   Problem: Metabolic: Goal: Ability to maintain appropriate glucose levels will improve Outcome: Progressing   Problem: Nutritional: Goal: Maintenance of adequate nutrition will improve Outcome: Progressing   Problem: Skin Integrity: Goal: Risk for impaired skin integrity will decrease Outcome: Completed/Met   Problem: Tissue Perfusion: Goal: Adequacy of tissue perfusion will improve Outcome: Completed/Met

## 2019-10-21 DIAGNOSIS — T783XXS Angioneurotic edema, sequela: Secondary | ICD-10-CM | POA: Diagnosis not present

## 2019-10-21 DIAGNOSIS — T783XXD Angioneurotic edema, subsequent encounter: Secondary | ICD-10-CM | POA: Diagnosis not present

## 2019-10-21 DIAGNOSIS — D72829 Elevated white blood cell count, unspecified: Secondary | ICD-10-CM

## 2019-10-21 DIAGNOSIS — I160 Hypertensive urgency: Secondary | ICD-10-CM | POA: Diagnosis not present

## 2019-10-21 DIAGNOSIS — T464X5S Adverse effect of angiotensin-converting-enzyme inhibitors, sequela: Secondary | ICD-10-CM

## 2019-10-21 DIAGNOSIS — N390 Urinary tract infection, site not specified: Secondary | ICD-10-CM

## 2019-10-21 DIAGNOSIS — A498 Other bacterial infections of unspecified site: Secondary | ICD-10-CM

## 2019-10-21 DIAGNOSIS — N179 Acute kidney failure, unspecified: Secondary | ICD-10-CM

## 2019-10-21 DIAGNOSIS — R7303 Prediabetes: Secondary | ICD-10-CM | POA: Diagnosis not present

## 2019-10-21 DIAGNOSIS — D649 Anemia, unspecified: Secondary | ICD-10-CM

## 2019-10-21 LAB — CBC WITH DIFFERENTIAL/PLATELET
Abs Immature Granulocytes: 0.11 10*3/uL — ABNORMAL HIGH (ref 0.00–0.07)
Basophils Absolute: 0 10*3/uL (ref 0.0–0.1)
Basophils Relative: 0 %
Eosinophils Absolute: 0 10*3/uL (ref 0.0–0.5)
Eosinophils Relative: 0 %
HCT: 32.8 % — ABNORMAL LOW (ref 36.0–46.0)
Hemoglobin: 10.1 g/dL — ABNORMAL LOW (ref 12.0–15.0)
Immature Granulocytes: 1 %
Lymphocytes Relative: 22 %
Lymphs Abs: 4.3 10*3/uL — ABNORMAL HIGH (ref 0.7–4.0)
MCH: 24.8 pg — ABNORMAL LOW (ref 26.0–34.0)
MCHC: 30.8 g/dL (ref 30.0–36.0)
MCV: 80.4 fL (ref 80.0–100.0)
Monocytes Absolute: 1.2 10*3/uL — ABNORMAL HIGH (ref 0.1–1.0)
Monocytes Relative: 6 %
Neutro Abs: 13.5 10*3/uL — ABNORMAL HIGH (ref 1.7–7.7)
Neutrophils Relative %: 71 %
Platelets: 349 10*3/uL (ref 150–400)
RBC: 4.08 MIL/uL (ref 3.87–5.11)
RDW: 16.9 % — ABNORMAL HIGH (ref 11.5–15.5)
WBC: 19.1 10*3/uL — ABNORMAL HIGH (ref 4.0–10.5)
nRBC: 0 % (ref 0.0–0.2)

## 2019-10-21 LAB — BASIC METABOLIC PANEL
Anion gap: 9 (ref 5–15)
BUN: 29 mg/dL — ABNORMAL HIGH (ref 8–23)
CO2: 27 mmol/L (ref 22–32)
Calcium: 8.9 mg/dL (ref 8.9–10.3)
Chloride: 103 mmol/L (ref 98–111)
Creatinine, Ser: 1.19 mg/dL — ABNORMAL HIGH (ref 0.44–1.00)
GFR, Estimated: 43 mL/min — ABNORMAL LOW (ref >60)
Glucose, Bld: 112 mg/dL — ABNORMAL HIGH (ref 70–99)
Potassium: 3.9 mmol/L (ref 3.5–5.1)
Sodium: 139 mmol/L (ref 135–145)

## 2019-10-21 LAB — MAGNESIUM: Magnesium: 2 mg/dL (ref 1.7–2.4)

## 2019-10-21 LAB — GLUCOSE, CAPILLARY
Glucose-Capillary: 95 mg/dL (ref 70–99)
Glucose-Capillary: 106 mg/dL — ABNORMAL HIGH (ref 70–99)

## 2019-10-21 MED ORDER — AMLODIPINE BESYLATE 10 MG PO TABS: 10.0000 mg | ORAL_TABLET | Freq: Every day | ORAL | 2 refills | Status: AC

## 2019-10-21 MED ORDER — DIPHENHYDRAMINE HCL 25 MG PO TABS: 12.5000 mg | ORAL_TABLET | Freq: Every day | ORAL | 0 refills | Status: AC

## 2019-10-21 MED ORDER — FAMOTIDINE 20 MG PO TABS: 20.0000 mg | ORAL_TABLET | Freq: Every day | ORAL | 0 refills | Status: AC

## 2019-10-21 MED ORDER — PREDNISONE 20 MG PO TABS: 20.0000 mg | ORAL_TABLET | Freq: Every day | ORAL | 0 refills | Status: AC

## 2019-10-21 MED ORDER — CEPHALEXIN 500 MG PO CAPS: 500.0000 mg | ORAL_CAPSULE | Freq: Four times a day (QID) | ORAL | 0 refills | Status: AC

## 2019-10-21 MED ORDER — CARVEDILOL 12.5 MG PO TABS: 12.5000 mg | ORAL_TABLET | Freq: Two times a day (BID) | ORAL | 2 refills | Status: AC

## 2019-10-21 NOTE — Discharge Summary (Signed)
Physician Discharge Summary  Laura French LDJ:[id number] DOB: 11-29-1940 DOA: 10/18/2019  PCP: Laura Dales, PA  Admit date: 10/18/2019 Discharge date: 10/21/2019  Time spent: 50 minutes  Recommendations for Outpatient Follow-up:  1. Follow-up with Laura Dales, PA in 1 week.  On follow-up patient's blood pressure needs to be reassessed.  Patient's Lotensin has been discontinued permanently due to concerns of angioedema.  Patient will need a basic metabolic profile done to follow-up on electrolytes and renal function.  CBC will also need to be done to follow-up on leukocytosis.  Urine culture sensitivities will need to be followed up upon on discharge.   Discharge Diagnoses:  Principal Problem:   Angioedema Active Problems:   Hypertensive urgency   Pre-diabetes   Angiotensin converting enzyme inhibitor-aggravated angioedema   Klebsiella pneumoniae infection   Acute lower UTI   Acute renal failure (HCC)   Anemia   Leukocytosis   Discharge Condition: Stable and improved.  Diet recommendation: Carb modified  Filed Weights   10/18/19 1906  Weight: 104.3 kg    History of present illness:  HPI per Dr. Velora Heckler French is a 79 y.o. female with history of hypertension, prediabetes last hemoglobin A1c in September was 6.3 asthmatic bronchitis started experiencing tongue swelling last evening after patient thought she had a tongue bite.  Following which patient start developing some difficulty breathing at this point patient decided to come to the ER.  Patient had been on lisinopril.  Denied any rash fever chills productive cough chest pain or any insect bites.  ED Course: In the ER patient was found to be having some difficulty breathing and tongue swelling and epipen was given 1 dose.  Given the patient had significant swelling patient is being admitted for further observation.  Blood pressure has been elevated.  Patient's tongue swelling is attributed to  angioedema likely from lisinopril.  Patient advised not to take it.  Labs are significant for creatinine of 1.23 WBC 12.8 hemoglobin 11 Covid test negative EKG normal sinus rhythm.  At the time of my exam patient's tongue swelling has completely resolved.  Hospital Course:  1 angioedema felt secondary to lisinopril Patient presented with angioedema with tongue swelling, shortness of breath felt secondary to lisinopril/ACE inhibitor.  Patient advised not to take ACE inhibitors anymore.  Patient given a dose of epi in the ED with clinical improvement.  Patient subsequently placed on Pepcid 20 mg IV every 12 hours, Benadryl 25 mg daily, prednisone 40 mg daily x3 days.  Patient improved clinically will be discharged home on Pepcid 20 mg daily x3 days, Benadryl 12.5 mg daily x3 days, prednisone 20 mg x 3 days.  Outpatient follow-up with PCP.  2.  Hypertensive urgency Patient noted to be in hypertensive urgency on admission.  Patient noted to be on Coreg as well as lisinopril prior to admission.  Lisinopril discontinued due to concerns that that was the etiology of patient's angioedema.  Patient was placed back on home regimen of Coreg 12.5 mg daily and Norvasc 5 mg started.  Norvasc was uptitrated to 10 mg daily for better blood pressure control.   3.  Prediabetes Last hemoglobin A1c was 6.3.    Patient maintained on sliding scale insulin throughout the hospitalization.  Patient's oral hypoglycemic agents were held and will be resumed on discharge.   4.  Anemia Hemoglobin remained stable throughout the hospitalization.  Outpatient follow-up with PCP.    5.  Acute renal failure In the setting of ACE inhibitor.  Felt likely secondary to a prerenal azotemia.  ACE inhibitor was discontinued due to concerns for angioedema and would not be resumed on discharge.  Renal function improved.  Outpatient follow-up with PCP.  6.    Klebsiella pneumoniae UTI Patient with increased urinary frequency, worsening  leukocytosis.  Chest x-ray negative for any acute infiltrate.  Urinalysis with large leukocytes, positive nitrite, greater than 50 WBCs.  Urine cultures preliminary results with > 100,000 colonies of Klebsiella pneumonia.  Patient initially placed on IV Rocephin with clinical improvement and improvement with leukocytosis by day of discharge.  Sensitivities were pending at time of discharge.  Patient be discharged home on 3 more days of oral Keflex to complete a 5-day course of antibiotic treatment.  Outpatient follow-up with PCP.  7.  Leukocytosis Patient noted to have a leukocytosis felt secondary to UTI and steroids.  Patient placed empirically on IV Rocephin pending urine culture results.  Preliminary urine cultures with Klebsiella pneumonia with sensitivities pending at time of discharge.  Chest x-ray done was negative.  Leukocytosis was trending down by day of discharge.  Patient remained afebrile throughout the hospitalization.  Outpatient follow-up.  Procedures:  None  Consultations:  None  Discharge Exam: Vitals:   10/21/19 0749 10/21/19 1319  BP:  (!) 143/55  Pulse:  70  Resp:  16  Temp:  98.9 F (37.2 C)  SpO2: 96% 97%    General: NAD Cardiovascular: RRR Respiratory: CTAB  Discharge Instructions   Discharge Instructions    Diet Carb Modified   Complete by: As directed    Increase activity slowly   Complete by: As directed    Increase activity slowly   Complete by: As directed      Allergies as of 10/21/2019      Reactions   Lisinopril Swelling   ANGIOEDEMA   Hydrocodone Itching   Hydrocodone-acetaminophen Itching, Rash   Morphine Itching   Potassium-containing Compounds Rash   Prednisone Itching      Medication List    STOP taking these medications   lisinopril 10 MG tablet Commonly known as: ZESTRIL     TAKE these medications   albuterol 108 (90 Base) MCG/ACT inhaler Commonly known as: VENTOLIN HFA Inhale 2 puffs into the lungs every 6 (six)  hours as needed for wheezing.   amLODipine 10 MG tablet Commonly known as: NORVASC Take 1 tablet (10 mg total) by mouth daily. Start taking on: October 22, 2019   Breo Ellipta 100-25 MCG/INH Aepb Generic drug: fluticasone furoate-vilanterol Inhale 1 puff into the lungs daily.   carvedilol 12.5 MG tablet Commonly known as: COREG Take 1 tablet (12.5 mg total) by mouth 2 (two) times daily with a meal.   cephALEXin 500 MG capsule Commonly known as: KEFLEX Take 1 capsule (500 mg total) by mouth 4 (four) times daily for 3 days.   Cholecalciferol 50 MCG (2000 UT) Tabs Take 1 tablet by mouth daily.   diphenhydrAMINE 25 MG tablet Commonly known as: BENADRYL Take 0.5 tablets (12.5 mg total) by mouth daily for 3 days.   DULoxetine 60 MG capsule Commonly known as: CYMBALTA Take 60 mg by mouth daily.   Enbrel Mini 50 MG/ML Soct Generic drug: Etanercept Inject 4 mLs into the skin every 28 (twenty-eight) days.   famotidine 20 MG tablet Commonly known as: PEPCID Take 1 tablet (20 mg total) by mouth daily for 3 days.   ferrous sulfate 324 MG Tbec Take 1 tablet by mouth daily.   metFORMIN 500 MG tablet  Commonly known as: GLUCOPHAGE Take 500 mg by mouth 2 (two) times daily.   montelukast 10 MG tablet Commonly known as: SINGULAIR Take 10 mg by mouth daily.   omeprazole 20 MG capsule Commonly known as: PRILOSEC Take 1 capsule by mouth daily.   polyethylene glycol powder 17 GM/SCOOP powder Commonly known as: GLYCOLAX/MIRALAX Take 17 g by mouth daily. Mix 1 capful in liquid and dink every day   predniSONE 20 MG tablet Commonly known as: DELTASONE Take 1 tablet (20 mg total) by mouth daily for 3 days.   tiZANidine 4 MG tablet Commonly known as: ZANAFLEX Take 2 tablets by mouth at bedtime.      Allergies  Allergen Reactions  . Lisinopril Swelling    ANGIOEDEMA  . Hydrocodone Itching  . Hydrocodone-Acetaminophen Itching and Rash  . Morphine Itching  .  Potassium-Containing Compounds Rash  . Prednisone Itching    Follow-up Information    Laura French, [place]. Schedule an appointment as soon as possible for a visit in 1 week(s).   Specialty: Family Medicine Contact information: [address] Med/HP [place] [address] [phone]                The results of significant diagnostics from this hospitalization (including imaging, microbiology, ancillary and laboratory) are listed below for reference.    Significant Diagnostic Studies: DG CHEST PORT 1 VIEW  Result Date: 10/20/2019 CLINICAL DATA:  Angioedema EXAM: PORTABLE CHEST 1 VIEW COMPARISON:  September 11, 2017 FINDINGS: Lungs are clear. Heart size and pulmonary vascularity are normal. No adenopathy. There is degenerative change in the thoracic spine. IMPRESSION: Lungs clear.  Cardiac silhouette within normal limits. Electronically Signed   By: Bretta Bang III M.D.   On: 10/20/2019 11:02    Microbiology: Recent Results (from the past 240 hour(s))  Resp Panel by RT PCR (RSV, Flu A&B, Covid) - Nasopharyngeal Swab     Status: None   Collection Time: 10/18/19  8:08 PM   Specimen: Nasopharyngeal Swab  Result Value Ref Range Status   SARS Coronavirus 2 by RT PCR NEGATIVE NEGATIVE Final    Comment: (NOTE) SARS-CoV-2 target nucleic acids are NOT DETECTED.  The SARS-CoV-2 RNA is generally detectable in upper respiratoy specimens during the acute phase of infection. The lowest concentration of SARS-CoV-2 viral copies this assay can detect is 131 copies/mL. A negative result does not preclude SARS-Cov-2 infection and should not be used as the sole basis for treatment or other patient management decisions. A negative result may occur with  improper specimen collection/handling, submission of specimen other than nasopharyngeal swab, presence of viral mutation(s) within the areas targeted by this assay, and inadequate number of viral  copies (<131 copies/mL). A negative result must be combined with clinical observations, patient history, and epidemiological information. The expected result is Negative.  Fact Sheet for Patients:  https://www.moore.com/  Fact Sheet for Healthcare Providers:  https://www.young.biz/  This test is no t yet approved or cleared by the [place] FDA and  has been authorized for detection and/or diagnosis of SARS-CoV-2 by FDA under an Emergency Use Authorization (EUA). This EUA will remain  in effect (meaning this test can be used) for the duration of the COVID-19 declaration under Section 564(b)(1) of the Act, 21 U.S.C. section 360bbb-3(b)(1), unless the authorization is terminated or revoked sooner.     Influenza A by PCR NEGATIVE NEGATIVE Final   Influenza B by PCR NEGATIVE NEGATIVE Final    Comment: (NOTE)  The Xpert Xpress SARS-CoV-2/FLU/RSV assay is intended as an aid in  the diagnosis of influenza from Nasopharyngeal swab specimens and  should not be used as a sole basis for treatment. Nasal washings and  aspirates are unacceptable for Xpert Xpress SARS-CoV-2/FLU/RSV  testing.  Fact Sheet for Patients: https://www.moore.com/  Fact Sheet for Healthcare Providers: https://www.young.biz/  This test is not yet approved or cleared by the [place] FDA and  has been authorized for detection and/or diagnosis of SARS-CoV-2 by  FDA under an Emergency Use Authorization (EUA). This EUA will remain  in effect (meaning this test can be used) for the duration of the  Covid-19 declaration under Section 564(b)(1) of the Act, 21  U.S.C. section 360bbb-3(b)(1), unless the authorization is  terminated or revoked.    Respiratory Syncytial Virus by PCR NEGATIVE NEGATIVE Final    Comment: (NOTE) Fact Sheet for Patients: https://www.moore.com/  Fact Sheet for Healthcare  Providers: https://www.young.biz/  This test is not yet approved or cleared by the [place] FDA and  has been authorized for detection and/or diagnosis of SARS-CoV-2 by  FDA under an Emergency Use Authorization (EUA). This EUA will remain  in effect (meaning this test can be used) for the duration of the  COVID-19 declaration under Section 564(b)(1) of the Act, 21 U.S.C.  section 360bbb-3(b)(1), unless the authorization is terminated or  revoked. Performed at [facility], [address] [place], [place] [address]   Culture, Urine     Status: Abnormal (Preliminary result)   Collection Time: 10/20/19  8:05 AM   Specimen: Urine, Catheterized  Result Value Ref Range Status   Specimen Description   Final    URINE, CATHETERIZED Performed at [facility], [address], [place], [place] [address]    Special Requests   Final    NONE Performed at [facility], [address] [place], [place] [address]    Culture (A)  Final    >=100,000 COLONIES/mL KLEBSIELLA PNEUMONIAE SUSCEPTIBILITIES TO FOLLOW Performed at [facility], [address] [place], [place] [address]    Report Status PENDING  Incomplete     Labs: Basic Metabolic Panel: Recent Labs  Lab 10/18/19 1902 10/20/19 0631 10/21/19 0519  NA 135 140 139  K 4.0 4.5 3.9  CL 99 103 103  CO2 23 26 27   GLUCOSE 130* 147* 112*  BUN 17 25* 29*  CREATININE 1.23* 1.14* 1.19*  CALCIUM 8.7* 9.6 8.9  MG  --  2.1 2.0   Liver Function Tests: Recent Labs  Lab 10/18/19 1902  AST 21  ALT 12  ALKPHOS 83  BILITOT 0.2*  PROT 8.0  ALBUMIN 3.9   No results for input(s): LIPASE, AMYLASE in the last 168 hours. No results for input(s): AMMONIA in the last 168 hours. CBC: Recent Labs  Lab 10/18/19 1902 10/19/19 0756 10/20/19 0631 10/21/19 0519  WBC 12.8* 15.0* 20.2* 19.1*  NEUTROABS 7.2 13.2* 18.1* 13.5*  HGB 11.0* 11.1* 10.5* 10.1*  HCT 35.7*  35.6* 33.4* 32.8*  MCV 79.7* 78.9* 79.9* 80.4  PLT 343 389 355 349   Cardiac Enzymes: No results for input(s): CKTOTAL, CKMB, CKMBINDEX, TROPONINI in the last 168 hours. BNP: BNP (last 3 results) No results for input(s): BNP in the last 8760 hours.  ProBNP (last 3 results) No results for input(s): PROBNP in the last 8760 hours.  CBG: Recent Labs  Lab 10/20/19 1222 10/20/19 1704 10/20/19 2130 10/21/19 0733 10/21/19 1114  GLUCAP  140* 140* 124* 95 106*       Signed:  Ramiro Harvest MD.  [facility] 10/21/2019, 4:02 PM

## 2019-10-21 NOTE — Evaluation (Addendum)
Occupational Therapy Evaluation and Discharge Patient Details Name: Laura French MRN: [id number] DOB: 1940-04-14 Today's Date: 10/21/2019    History of Present Illness Laura French is a 79 y.o. female with history of hypertension, prediabetes last hemoglobin A1c in September was 6.3 asthmatic bronchitis started experiencing tongue swelling last evening after patient thought she had a tongue bite.  Following which patient start developing some difficulty breathing at this point   Clinical Impression   This 79 yo female admitted with above presents to acute OT with PLOF of being totally independent with basic ADLs and IADLs and is currently independent/mod I (furniture walks--as she does at home) getting around in her hospital room, no further OT needs, we will sign off.   Follow Up Recommendations  No OT follow up    Equipment Recommendations  3 in 1 bedside commode       Precautions / Restrictions Precautions Precautions: None Restrictions Weight Bearing Restrictions: No      Mobility Bed Mobility Overal bed mobility: Independent                Transfers Overall transfer level: Independent                    Balance Overall balance assessment: No apparent balance deficits (not formally assessed)                                         ADL either performed or assessed with clinical judgement   ADL Overall ADL's : Independent                                             Vision Baseline Vision/History: Wears glasses Wears Glasses: At all times Patient Visual Report: No change from baseline              Pertinent Vitals/Pain Pain Assessment: No/denies pain     Hand Dominance Right   Extremity/Trunk Assessment Upper Extremity Assessment Upper Extremity Assessment: Overall WFL for tasks assessed   Lower Extremity Assessment Lower Extremity Assessment: Overall WFL for tasks assessed       Communication  Communication Communication: No difficulties   Cognition Arousal/Alertness: Awake/alert Behavior During Therapy: WFL for tasks assessed/performed Overall Cognitive Status: Within Functional Limits for tasks assessed                                                Home Living Family/patient expects to be discharged to:: Private residence Living Arrangements: Alone Available Help at Discharge: Family;Available PRN/intermittently Type of Home: Apartment Home Access: Elevator     Home Layout: One level     Bathroom Shower/Tub: Tub/shower unit;Curtain   Firefighter: Standard     Home Equipment: Environmental consultant - 2 wheels;Shower seat;Cane - single point   Additional Comments: upright rollator      Prior Functioning/Environment Level of Independence: Independent        Comments: Does not use AD in house, when out and about uses upright rollator if at all possible--otherwise uses SPC; does report she "furniture walks at times inside";does not drive  OT Goals(Current goals can be found in the care plan section) Acute Rehab OT Goals Patient Stated Goal: to go home soon  OT Frequency:             Co-evaluation PT/OT/SLP Co-Evaluation/Treatment: Yes Reason for Co-Treatment: For patient/therapist safety;To address functional/ADL transfers PT goals addressed during session: Mobility/safety with mobility;Balance;Strengthening/ROM OT goals addressed during session: ADL's and self-care;Strengthening/ROM      AM-PAC OT "6 Clicks" Daily Activity     Outcome Measure Help from another person eating meals?: None Help from another person taking care of personal grooming?: None Help from another person toileting, which includes using toliet, bedpan, or urinal?: None Help from another person bathing (including washing, rinsing, drying)?: None Help from another person to put on and taking off regular upper body clothing?: None Help from another person  to put on and taking off regular lower body clothing?: None 6 Click Score: 24   End of Session Equipment Utilized During Treatment: Gait belt  Activity Tolerance: Patient tolerated treatment well Patient left: in chair;with call bell/phone within reach;with chair alarm set  OT Visit Diagnosis: Muscle weakness (generalized) (M62.81)                Time: 1053-1110 OT Time Calculation (min): 17 min Charges:  OT General Charges $OT Visit: 1 Visit OT Evaluation $OT Eval Moderate Complexity: 1 Mod  Ignacia Palma, OTR/L Acute [employer] Pager [phone] Office [phone]     Evette Georges 10/21/2019, 12:05 PM

## 2019-10-21 NOTE — Evaluation (Signed)
Physical Therapy Evaluation Patient Details Name: Laura French MRN: [id number] DOB: 01/27/1940 Today's Date: 10/21/2019   History of Present Illness  79 y.o. female with history of hypertension, prediabetes last hemoglobin A1c in September was 6.3 asthmatic bronchitis started experiencing tongue swelling last evening after patient thought she had a tongue bite.  Following which patient start developing some difficulty breathing at this point  Clinical Impression  On eval, pt was Supv-Mod Ind with mobility. She walked ~75 feet in the hallway. Distance was limited by chronic knee and back pain. Dyspnea 2/4 but O2 sat 95% on RA. Pt reported she is feeling better and she is ready to d/c home once cleared by MD. Will plan to follow pt during hospital stay. Do not anticipate any f/u PT needs after discharge.     Follow Up Recommendations No PT follow up    Equipment Recommendations  None recommended by PT    Recommendations for Other Services       Precautions / Restrictions Precautions Precautions: Fall Restrictions Weight Bearing Restrictions: No      Mobility  Bed Mobility Overal bed mobility: Independent                Transfers Overall transfer level: Independent                  Ambulation/Gait Ambulation/Gait assistance: Supervision Gait Distance (Feet): 75 Feet Assistive device: None (vs hallway handrail) Gait Pattern/deviations: Step-through pattern;Decreased stride length     General Gait Details: Pt reports she either "furniture walks" or uses RW. Dyspnea 2/5. O2 95%. Pt denied dizziness. Distance limited by knee and back pain  Stairs            Wheelchair Mobility    Modified Rankin (Stroke Patients Only)       Balance Overall balance assessment: Mild deficits observed, not formally tested                                           Pertinent Vitals/Pain Pain Assessment: No/denies pain    Home Living  Family/patient expects to be discharged to:: Private residence Living Arrangements: Alone Available Help at Discharge: Family;Available PRN/intermittently Type of Home: Apartment Home Access: Elevator     Home Layout: One level Home Equipment: Walker - 2 wheels;Shower seat;Cane - single point Additional Comments: upright rollator    Prior Function Level of Independence: Independent         Comments: Does not use AD in house, when out and about uses upright rollator if at all possible--otherwise uses SPC; does report she "furniture walks at times inside";does not drive     Hand Dominance   Dominant Hand: Right    Extremity/Trunk Assessment   Upper Extremity Assessment Upper Extremity Assessment: Defer to OT evaluation    Lower Extremity Assessment Lower Extremity Assessment: Generalized weakness    Cervical / Trunk Assessment Cervical / Trunk Assessment: Normal  Communication   Communication: No difficulties  Cognition Arousal/Alertness: Awake/alert Behavior During Therapy: WFL for tasks assessed/performed Overall Cognitive Status: Within Functional Limits for tasks assessed                                        General Comments      Exercises     Assessment/Plan  PT Assessment Patient needs continued PT services  PT Problem List Pain;Decreased balance;Decreased activity tolerance       PT Treatment Interventions Gait training;Therapeutic activities;Therapeutic exercise;Patient/family education;Functional mobility training;Balance training    PT Goals (Current goals can be found in the Care Plan section)  Acute Rehab PT Goals Patient Stated Goal: to go home soon PT Goal Formulation: With patient Time For Goal Achievement: 11/04/19 Potential to Achieve Goals: Good    Frequency Min 3X/week   Barriers to discharge        Co-evaluation   Reason for Co-Treatment: For patient/therapist safety;To address functional/ADL transfers PT  goals addressed during session: Mobility/safety with mobility;Balance;Strengthening/ROM OT goals addressed during session: ADL's and self-care;Strengthening/ROM       AM-PAC PT "6 Clicks" Mobility  Outcome Measure Help needed turning from your back to your side while in a flat bed without using bedrails?: None Help needed moving from lying on your back to sitting on the side of a flat bed without using bedrails?: None Help needed moving to and from a bed to a chair (including a wheelchair)?: None Help needed standing up from a chair using your arms (e.g., wheelchair or bedside chair)?: None Help needed to walk in hospital room?: A Little Help needed climbing 3-5 steps with a railing? : A Little 6 Click Score: 22    End of Session Equipment Utilized During Treatment: Gait belt Activity Tolerance: Patient tolerated treatment well Patient left: in chair;with call bell/phone within reach;with chair alarm set   PT Visit Diagnosis: Unsteadiness on feet (R26.81)    Time: 6010-9323 PT Time Calculation (min) (ACUTE ONLY): 16 min   Charges:   PT Evaluation $PT Eval Low Complexity: 1 Low            Faye Ramsay, PT Acute Rehabilitation  Office: [phone] Pager: [phone]

## 2019-10-22 LAB — URINE CULTURE: Culture: >=100000 — AB

## 2020-09-12 ENCOUNTER — Emergency Department (HOSPITAL_BASED_OUTPATIENT_CLINIC_OR_DEPARTMENT_OTHER): Payer: Medicare HMO

## 2020-09-12 ENCOUNTER — Other Ambulatory Visit: Payer: Self-pay

## 2020-09-12 ENCOUNTER — Emergency Department (HOSPITAL_BASED_OUTPATIENT_CLINIC_OR_DEPARTMENT_OTHER)
Admission: EM | Admit: 2020-09-12 | Discharge: 2020-09-12 | Disposition: A | Discharge status: Home / Self Care | Payer: Medicare HMO | Attending: Emergency Medicine | Admitting: Emergency Medicine

## 2020-09-12 DIAGNOSIS — M25552 Pain in left hip: Secondary | ICD-10-CM | POA: Insufficient documentation

## 2020-09-12 DIAGNOSIS — R42 Dizziness and giddiness: Secondary | ICD-10-CM | POA: Diagnosis present

## 2020-09-12 DIAGNOSIS — Z79899 Other long term (current) drug therapy: Secondary | ICD-10-CM | POA: Insufficient documentation

## 2020-09-12 DIAGNOSIS — R0602 Shortness of breath: Secondary | ICD-10-CM | POA: Diagnosis not present

## 2020-09-12 DIAGNOSIS — Z7984 Long term (current) use of oral hypoglycemic drugs: Secondary | ICD-10-CM | POA: Diagnosis not present

## 2020-09-12 DIAGNOSIS — I10 Essential (primary) hypertension: Secondary | ICD-10-CM | POA: Diagnosis not present

## 2020-09-12 DIAGNOSIS — Z20822 Contact with and (suspected) exposure to covid-19: Secondary | ICD-10-CM | POA: Insufficient documentation

## 2020-09-12 DIAGNOSIS — R519 Headache, unspecified: Secondary | ICD-10-CM | POA: Diagnosis not present

## 2020-09-12 DIAGNOSIS — M62838 Other muscle spasm: Secondary | ICD-10-CM | POA: Insufficient documentation

## 2020-09-12 DIAGNOSIS — E119 Type 2 diabetes mellitus without complications: Secondary | ICD-10-CM | POA: Insufficient documentation

## 2020-09-12 DIAGNOSIS — M25512 Pain in left shoulder: Secondary | ICD-10-CM | POA: Diagnosis not present

## 2020-09-12 LAB — CBC WITH DIFFERENTIAL/PLATELET
Abs Immature Granulocytes: 0.03 10*3/uL (ref 0.00–0.07)
Basophils Absolute: 0 10*3/uL (ref 0.0–0.1)
Basophils Relative: 0 %
Eosinophils Absolute: 0.1 10*3/uL (ref 0.0–0.5)
Eosinophils Relative: 1 %
HCT: 34.2 % — ABNORMAL LOW (ref 36.0–46.0)
Hemoglobin: 10.6 g/dL — ABNORMAL LOW (ref 12.0–15.0)
Immature Granulocytes: 0 %
Lymphocytes Relative: 31 %
Lymphs Abs: 3.8 10*3/uL (ref 0.7–4.0)
MCH: 23.7 pg — ABNORMAL LOW (ref 26.0–34.0)
MCHC: 31 g/dL (ref 30.0–36.0)
MCV: 76.5 fL — ABNORMAL LOW (ref 80.0–100.0)
Monocytes Absolute: 0.8 10*3/uL (ref 0.1–1.0)
Monocytes Relative: 7 %
Neutro Abs: 7.5 10*3/uL (ref 1.7–7.7)
Neutrophils Relative %: 61 %
Platelets: 384 10*3/uL (ref 150–400)
RBC: 4.47 MIL/uL (ref 3.87–5.11)
RDW: 18.3 % — ABNORMAL HIGH (ref 11.5–15.5)
WBC: 12.2 10*3/uL — ABNORMAL HIGH (ref 4.0–10.5)
nRBC: 0 % (ref 0.0–0.2)

## 2020-09-12 LAB — COMPREHENSIVE METABOLIC PANEL
ALT: 10 U/L (ref 0–44)
AST: 15 U/L (ref 15–41)
Albumin: 3.9 g/dL (ref 3.5–5.0)
Alkaline Phosphatase: 94 U/L (ref 38–126)
Anion gap: 10 (ref 5–15)
BUN: 13 mg/dL (ref 8–23)
CO2: 25 mmol/L (ref 22–32)
Calcium: 9.1 mg/dL (ref 8.9–10.3)
Chloride: 106 mmol/L (ref 98–111)
Creatinine, Ser: 1.18 mg/dL — ABNORMAL HIGH (ref 0.44–1.00)
GFR, Estimated: 46 mL/min — ABNORMAL LOW (ref >60)
Glucose, Bld: 114 mg/dL — ABNORMAL HIGH (ref 70–99)
Potassium: 3.6 mmol/L (ref 3.5–5.1)
Sodium: 141 mmol/L (ref 135–145)
Total Bilirubin: 0.1 mg/dL — ABNORMAL LOW (ref 0.3–1.2)
Total Protein: 7.8 g/dL (ref 6.5–8.1)

## 2020-09-12 LAB — TROPONIN I (HIGH SENSITIVITY): Troponin I (High Sensitivity): 6 ng/L (ref <18)

## 2020-09-12 LAB — RESP PANEL BY RT-PCR (FLU A&B, COVID) ARPGX2
Influenza A by PCR: NEGATIVE
Influenza B by PCR: NEGATIVE
SARS Coronavirus 2 by RT PCR: NEGATIVE

## 2020-09-12 LAB — D-DIMER, QUANTITATIVE: D-Dimer, Quant: 0.77 ug/mL-FEU — ABNORMAL HIGH (ref 0.00–0.50)

## 2020-09-12 LAB — BRAIN NATRIURETIC PEPTIDE: B Natriuretic Peptide: 54.5 pg/mL (ref 0.0–100.0)

## 2020-09-12 MED ORDER — CYCLOBENZAPRINE HCL 5 MG PO TABS: 5.0000 mg | ORAL_TABLET | Freq: Two times a day (BID) | ORAL | 0 refills | Status: AC | PRN

## 2020-09-12 MED ORDER — DIAZEPAM 5 MG/ML IJ SOLN
2.5000 mg | Freq: Once | INTRAMUSCULAR | Status: AC
  Administered 2020-09-12: 2.5 mg via INTRAVENOUS
  Filled 2020-09-12: qty 2

## 2020-09-12 MED ORDER — SODIUM CHLORIDE 0.9 % IV BOLUS
1000.0000 mL | Freq: Once | INTRAVENOUS | Status: AC
  Administered 2020-09-12: 1000 mL via INTRAVENOUS

## 2020-09-12 NOTE — Discharge Instructions (Addendum)
Overall work-up today is unremarkable.  Suspect her symptoms are secondary to arthritis/muscle spasms.  Continue Tylenol.  I have prescribed a muscle relaxant for you as well.  Do not mix with alcohol or any other drugs as this medication is mildly sedating.  Please make sure you are always using your assisted walking devices.  Follow-up with your primary care doctor.  Suspect he may benefit from some physical therapy/orthopedic referral.  Incidentally today on your CT scan appeared to have a thyroid nodule, please follow-up with your primary care doctor to discuss getting an ultrasound for further evaluation.

## 2020-09-12 NOTE — ED Provider Notes (Signed)
[facility] EMERGENCY DEPARTMENT Provider Note   CSN: [id number] Arrival date & time: 09/12/20  1328     History Chief Complaint  Patient presents with   Dizziness   Shortness of Breath    Laura French is a 80 y.o. female.  Patient here for several issues.  She states that she has had left upper arm and left hip pain on and off for the last several weeks.  Seems to have occurred after a fall several months ago.  She is starting to have some pain in the lower neck area, left shoulder area as well as some headaches and dizziness.  She has had some shortness of breath during this time as well.  Denies any cough or sputum production.  States that she is always short of breath.  She is having pain in the left hip but able to walk with a walker.  She denies any vision changes, speech changes, weakness, numbness.  Dizziness and weakness and discomfort have gotten worse over the last 2 days.  No new falls.  She denies any abdominal pain or pain with urination.  She has no history of stroke or coronary artery disease.  She has been using Tylenol with not much help.  Walking makes pain worse.  She saw her primary care doctor 2 days ago who ordered outpatient x-rays of her extremities.  Supposed to maybe get a referral to orthopedics.       Past Medical History:  Diagnosis Date   Chronic back pain    Constipation    Depression    Diabetes mellitus without complication (HCC)    GERD (gastroesophageal reflux disease)    Hypertension     Patient Active Problem List   Diagnosis Date Noted   Klebsiella pneumoniae infection    Acute lower UTI    Acute renal failure (HCC)    Anemia    Leukocytosis    Hypertensive urgency 10/19/2019   Pre-diabetes 10/19/2019   Angiotensin converting enzyme inhibitor-aggravated angioedema    Angioedema 10/18/2019    Past Surgical History:  Procedure Laterality Date   BACK SURGERY       OB History   No obstetric history on file.      Family History  Problem Relation Age of Onset   Hypertension Father     Social History   Tobacco Use   Smoking status: Never   Smokeless tobacco: Never  Substance Use Topics   Alcohol use: No   Drug use: No    Home Medications Prior to Admission medications   Medication Sig Start Date End Date Taking? Authorizing Provider  cyclobenzaprine (FLEXERIL) 5 MG tablet Take 1 tablet (5 mg total) by mouth 2 (two) times daily as needed for up to 20 days for muscle spasms. 09/12/20 10/02/20 Yes Elyanah Farino, DO  albuterol (VENTOLIN HFA) 108 (90 Base) MCG/ACT inhaler Inhale 2 puffs into the lungs every 6 (six) hours as needed for wheezing. 06/29/11   [provider]  amLODipine (NORVASC) 10 MG tablet Take 1 tablet (10 mg total) by mouth daily. 10/22/19   Rodolph Bonghompson, Daniel V, MD  BREO ELLIPTA 100-25 MCG/INH AEPB Inhale 1 puff into the lungs daily. 08/27/19   [provider]  carvedilol (COREG) 12.5 MG tablet Take 1 tablet (12.5 mg total) by mouth 2 (two) times daily with a meal. 10/21/19   Rodolph Bonghompson, Daniel V, MD  Cholecalciferol 50 MCG (2000 UT) TABS Take 1 tablet by mouth daily.    [provider]  diphenhydrAMINE (BENADRYL) 25 MG tablet Take 0.5 tablets (12.5 mg total) by mouth daily for 3 days. 10/21/19 10/24/19  Rodolph Bong, MD  DULoxetine (CYMBALTA) 60 MG capsule Take 60 mg by mouth daily. 10/01/19   [provider]  ENBREL MINI 50 MG/ML SOCT Inject 4 mLs into the skin every 28 (twenty-eight) days. 10/02/19   [provider]  famotidine (PEPCID) 20 MG tablet Take 1 tablet (20 mg total) by mouth daily for 3 days. 10/21/19 10/24/19  Rodolph Bong, MD  ferrous sulfate 324 MG TBEC Take 1 tablet by mouth daily. 03/08/19   [provider]  metFORMIN (GLUCOPHAGE) 500 MG tablet Take 500 mg by mouth 2 (two) times daily. 09/30/19   [provider]  montelukast (SINGULAIR) 10 MG tablet Take 10 mg by mouth daily. 09/26/19   [provider]  omeprazole (PRILOSEC) 20 MG capsule Take 1 capsule by mouth daily. 12/11/15   [provider]  polyethylene glycol powder (GLYCOLAX/MIRALAX) 17 GM/SCOOP powder Take 17 g by mouth daily. Mix 1 capful in liquid and dink every day 05/25/11   [provider]  tiZANidine (ZANAFLEX) 4 MG tablet Take 2 tablets by mouth at bedtime. 06/10/16   [provider]    Allergies    Lisinopril, Hydrocodone, Hydrocodone-acetaminophen, Morphine, Potassium-containing compounds, and Prednisone  Review of Systems   Review of Systems  Constitutional:  Positive for fatigue. Negative for chills and fever.  HENT:  Negative for ear pain and sore throat.   Eyes:  Negative for pain and visual disturbance.  Respiratory:  Positive for shortness of breath. Negative for cough.   Cardiovascular:  Negative for chest pain and palpitations.  Gastrointestinal:  Negative for abdominal pain, nausea and vomiting.  Genitourinary:  Negative for dysuria and hematuria.  Musculoskeletal:  Positive for arthralgias, gait problem and neck pain. Negative for back pain.  Skin:  Negative for color change and rash.  Neurological:  Positive for dizziness and headaches. Negative for tremors, seizures, syncope, facial asymmetry, speech difficulty, weakness, light-headedness and numbness.  All other systems reviewed and are negative.  Physical Exam Updated Vital Signs BP (!) 166/57 (BP Location: Right Arm)   Pulse 65   Temp 98.6 F (37 C) (Oral)   Resp 16   Ht 5\' 3"  (1.6 m)   Wt 104.3 kg   SpO2 94%   BMI 40.73 kg/m   Physical Exam Vitals and nursing note reviewed.  Constitutional:      General: She is not in acute distress.    Appearance: She is well-developed. She is not ill-appearing.  HENT:     Head: Normocephalic and atraumatic.     Mouth/Throat:     Mouth: Mucous membranes are moist.  Eyes:     Extraocular Movements: Extraocular movements intact.     Conjunctiva/sclera: Conjunctivae  normal.     Pupils: Pupils are equal, round, and reactive to light.  Cardiovascular:     Rate and Rhythm: Normal rate and regular rhythm.     Pulses: Normal pulses.     Heart sounds: Normal heart sounds. No murmur heard. Pulmonary:     Effort: Pulmonary effort is normal. No respiratory distress.     Breath sounds: Normal breath sounds. No decreased breath sounds, wheezing, rhonchi or rales.  Abdominal:     Palpations: Abdomen is soft.     Tenderness: There is no abdominal tenderness.  Musculoskeletal:        General: Normal range of motion.  Cervical back: Normal range of motion and neck supple.     Right lower leg: No edema.     Left lower leg: No edema.     Comments: Tenderness to bilateral upper trapezius area, tenderness to paraspinal muscles of the cervical spine on the left, no midline spinal pain, tenderness to the left shoulder, tenderness to the left hip  Skin:    General: Skin is warm and dry.     Capillary Refill: Capillary refill takes less than 2 seconds.  Neurological:     General: No focal deficit present.     Mental Status: She is alert and oriented to person, place, and time.     Cranial Nerves: No cranial nerve deficit.     Motor: No weakness.     Comments: 5+ out of 5 strength throughout, normal sensation, no drift, normal finger-to-nose finger, normal speech, no visual field deficit  Psychiatric:        Mood and Affect: Mood normal.    ED Results / Procedures / Treatments   Labs (all labs ordered are listed, but only abnormal results are displayed) Labs Reviewed  CBC WITH DIFFERENTIAL/PLATELET - Abnormal; Notable for the following components:      Result Value   WBC 12.2 (*)    Hemoglobin 10.6 (*)    HCT 34.2 (*)    MCV 76.5 (*)    MCH 23.7 (*)    RDW 18.3 (*)    All other components within normal limits  COMPREHENSIVE METABOLIC PANEL - Abnormal; Notable for the following components:   Glucose, Bld 114 (*)    Creatinine, Ser 1.18 (*)    Total  Bilirubin 0.1 (*)    GFR, Estimated 46 (*)    All other components within normal limits  D-DIMER, QUANTITATIVE - Abnormal; Notable for the following components:   D-Dimer, Quant 0.77 (*)    All other components within normal limits  RESP PANEL BY RT-PCR (FLU A&B, COVID) ARPGX2  BRAIN NATRIURETIC PEPTIDE  TROPONIN I (HIGH SENSITIVITY)    EKG EKG Interpretation  Date/Time:  Saturday September 12 2020 13:39:08 EDT Ventricular Rate:  70 PR Interval:  192 QRS Duration: 102 QT Interval:  399 QTC Calculation: 431 R Axis:   -22 Text Interpretation: Sinus rhythm Borderline left axis deviation Low voltage, precordial leads Abnormal R-wave progression, early transition Confirmed by Virgina Norfolk ([id number]) on 09/12/2020 1:57:10 PM  Radiology CT HEAD WO CONTRAST ( )  Result Date: 09/12/2020 CLINICAL DATA:  Dizziness and shortness of breath since yesterday. Left body pain from the shoulder and down the leg. EXAM: CT HEAD WITHOUT CONTRAST CT CERVICAL SPINE WITHOUT CONTRAST TECHNIQUE: Multidetector CT imaging of the head and cervical spine was performed following the standard protocol without intravenous contrast. Multiplanar CT image reconstructions of the cervical spine were also generated. COMPARISON:  Head CT dated 08/25/2018. FINDINGS: CT HEAD FINDINGS Brain: Stable minimally enlarged ventricles and subarachnoid spaces. Stable minimal patchy white matter low density in both cerebral hemispheres. No intracranial hemorrhage, mass lesion or CT evidence of acute infarction. Vascular: No hyperdense vessel or unexpected calcification. Skull: Normal. Negative for fracture or focal lesion. Sinuses/Orbits: Status post bilateral cataract extraction. Unremarkable bones and included paranasal sinuses. Other: Left concha bullosa. CT CERVICAL SPINE FINDINGS Alignment: Normal. Skull base and vertebrae: No acute fracture. No primary bone lesion or focal pathologic process. Soft tissues and spinal canal: No prevertebral  fluid or swelling. No visible canal hematoma. Disc levels:  Multilevel degenerative changes. Upper chest: Clear lung apices.  Other: Enlarged thyroid gland, especially on the left, with deviation of the trachea to the right at the thoracic inlet. Right carotid artery atheromatous calcifications. IMPRESSION: 1. No acute abnormality in the head or cervical spine. 2. Stable minimal diffuse cerebral and cerebellar atrophy and minimal chronic small vessel white matter ischemic changes in both cerebral hemispheres. 3. Multilevel cervical spine degenerative changes. 4. Thyroid goiter. Recommend thyroid ultrasound (ref: J Am Coll Radiol. 2015 Feb;12(2): 143-50). Electronically Signed   By: Beckie Salts M.D.   On: 09/12/2020 15:07   CT Cervical Spine Wo Contrast  Result Date: 09/12/2020 CLINICAL DATA:  Dizziness and shortness of breath since yesterday. Left body pain from the shoulder and down the leg. EXAM: CT HEAD WITHOUT CONTRAST CT CERVICAL SPINE WITHOUT CONTRAST TECHNIQUE: Multidetector CT imaging of the head and cervical spine was performed following the standard protocol without intravenous contrast. Multiplanar CT image reconstructions of the cervical spine were also generated. COMPARISON:  Head CT dated 08/25/2018. FINDINGS: CT HEAD FINDINGS Brain: Stable minimally enlarged ventricles and subarachnoid spaces. Stable minimal patchy white matter low density in both cerebral hemispheres. No intracranial hemorrhage, mass lesion or CT evidence of acute infarction. Vascular: No hyperdense vessel or unexpected calcification. Skull: Normal. Negative for fracture or focal lesion. Sinuses/Orbits: Status post bilateral cataract extraction. Unremarkable bones and included paranasal sinuses. Other: Left concha bullosa. CT CERVICAL SPINE FINDINGS Alignment: Normal. Skull base and vertebrae: No acute fracture. No primary bone lesion or focal pathologic process. Soft tissues and spinal canal: No prevertebral fluid or swelling.  No visible canal hematoma. Disc levels:  Multilevel degenerative changes. Upper chest: Clear lung apices. Other: Enlarged thyroid gland, especially on the left, with deviation of the trachea to the right at the thoracic inlet. Right carotid artery atheromatous calcifications. IMPRESSION: 1. No acute abnormality in the head or cervical spine. 2. Stable minimal diffuse cerebral and cerebellar atrophy and minimal chronic small vessel white matter ischemic changes in both cerebral hemispheres. 3. Multilevel cervical spine degenerative changes. 4. Thyroid goiter. Recommend thyroid ultrasound (ref: J Am Coll Radiol. 2015 Feb;12(2): 143-50). Electronically Signed   By: Beckie Salts M.D.   On: 09/12/2020 15:07   DG Chest Portable 1 View  Result Date: 09/12/2020 CLINICAL DATA:  Shortness of breath and dizziness. EXAM: PORTABLE CHEST 1 VIEW COMPARISON:  Chest x-ray dated October 20, 2019. FINDINGS: The heart size and mediastinal contours are within normal limits. Both lungs are clear. The visualized skeletal structures are unremarkable. IMPRESSION: No active disease. Electronically Signed   By: Obie Dredge M.D.   On: 09/12/2020 14:25   DG Shoulder Left  Result Date: 09/12/2020 CLINICAL DATA:  Left shoulder pain.  No injury. EXAM: LEFT SHOULDER - 2+ VIEW COMPARISON:  Left shoulder x-rays dated September 10, 2020. FINDINGS: No acute fracture or dislocation. Acromioclavicular and glenohumeral degenerative changes. Soft tissues are unremarkable. IMPRESSION: 1. No acute osseous abnormality. Electronically Signed   By: Obie Dredge M.D.   On: 09/12/2020 14:27   DG Hip Unilat With Pelvis 2-3 Views Left  Result Date: 09/12/2020 CLINICAL DATA:  Left hip pain. EXAM: DG HIP (WITH OR WITHOUT PELVIS) 2-3V LEFT COMPARISON:  Left hip x-rays dated January 31, 2014. FINDINGS: No acute fracture or dislocation. Similar mild degenerative changes of both hip and sacroiliac joints. Lumbar fusion. Soft tissues are unremarkable.  IMPRESSION: 1. No acute osseous abnormality. Electronically Signed   By: Obie Dredge M.D.   On: 09/12/2020 14:36    Procedures Procedures   Medications Ordered  in ED Medications  diazepam (VALIUM) injection 2.5 mg (2.5 mg Intravenous Given 09/12/20 1400)  sodium chloride 0.9 % bolus 1,000 mL (1,000 mLs Intravenous New Bag/Given 09/12/20 1404)    ED Course  I have reviewed the triage vital signs and the nursing notes.  Pertinent labs & imaging results that were available during my care of the patient were reviewed by me and considered in my medical decision making (see chart for details).    MDM Rules/Calculators/A&P                           Laura French is an 80 year old female with history of hypertension, chronic back pain, depression, diabetes who presents to the ED with several issues.  Patient with overall unremarkable vitals.  No fever.  Has been having left upper extremity and left lower leg pain on and off for the last several weeks.  Pain mostly to the left shoulder and left hip.  Having some headaches, left-sided neck pain, dizziness as well.  The last several days.  Denies any weakness or numbness or speech changes or vision changes.  Denies any chest pain or abdominal pain.  Has had shortness of breath but no sputum production.  Does have history of bronchitis.  Breath sounds are clear.  Neurologic exam is unremarkable.  EKG shows sinus rhythm with no ischemic changes.  She has tenderness in the left trapezius area as well as the right trapezius area.  She also has pain with range of motion of her left shoulder and left hip.  Suspect that symptoms are mostly musculoskeletal.  Have very low suspicion for stroke or ACS or PE.  However we will get lab work including troponin, D-dimer.  Will get chest x-ray, left shoulder and left hip x-ray.  We will get head and neck CT.  We will give a dose of IV Valium to help with muscle spasms and reevaluate.  She has seen her primary care  doctor for the same and x-rays were ordered outpatient but cannot see those results.  There was some with thought that this was musculoskeletal and may be arthritis base and may need orthopedic referral.  She has been using Tylenol with minimal relief.  She does not have any leg swelling and no signs of volume overload on exam.  No history of heart failure.  D-dimer age-adjusted normal doubt PE.  Troponin within normal limits and doubt ACS.  EKG with sinus rhythm.  No ischemic changes.  No significant leukocytosis, anemia, electrolyte abnormality, kidney injury.  X-ray showed no fracture of the shoulder or hip.  Some arthritic changes.  Chest x-ray without any evidence of infection.  BNP within normal limits and doubt heart failure.  COVID test is pending.  Upon reevaluation patient feels much better after Valium.  Symptoms have improved greatly.  Awaiting CT scan of head and neck and anticipate discharge to home with Flexeril.  Recommend ongoing use of Tylenol and follow-up with primary care doctor.  May need orthopedics referral or physical therapy referral.  Suspect most of her symptoms today are secondary to musculoskeletal issues.  CT scan of head and neck is unremarkable.  Patient appears to have a thyroid goiter.  Understands to follow-up with primary care doctor for ultrasound.  Discharged in good condition.  Understands return precautions.  This chart was dictated using voice recognition software.  Despite best efforts to proofread,  errors can occur which can change the documentation meaning.  Final Clinical Impression(s) / ED Diagnoses Final diagnoses:  Dizziness  Muscle spasm  Acute pain of left shoulder  Left hip pain    Rx / DC Orders ED Discharge Orders          Ordered    cyclobenzaprine (FLEXERIL) 5 MG tablet  2 times daily PRN        09/12/20 1511             Virgina Norfolk, DO 09/12/20 1514

## 2020-09-12 NOTE — ED Notes (Signed)
ED Provider at bedside. 

## 2020-09-12 NOTE — ED Triage Notes (Signed)
Pt states Dizziness/SOB since yesterday, states left sided pain from left shoulder down left leg.  No h/o stroke.

## 2020-10-13 ENCOUNTER — Encounter (HOSPITAL_COMMUNITY): Payer: Self-pay | Admitting: Radiology

## 2021-10-23 IMAGING — DX DG CHEST 1V PORT
1 series · 1 of 1 positions shown · non-contrast
Comparison: September 11, 2017

CLINICAL DATA: Angioedema

EXAM:
PORTABLE CHEST 1 VIEW

[chest ap]
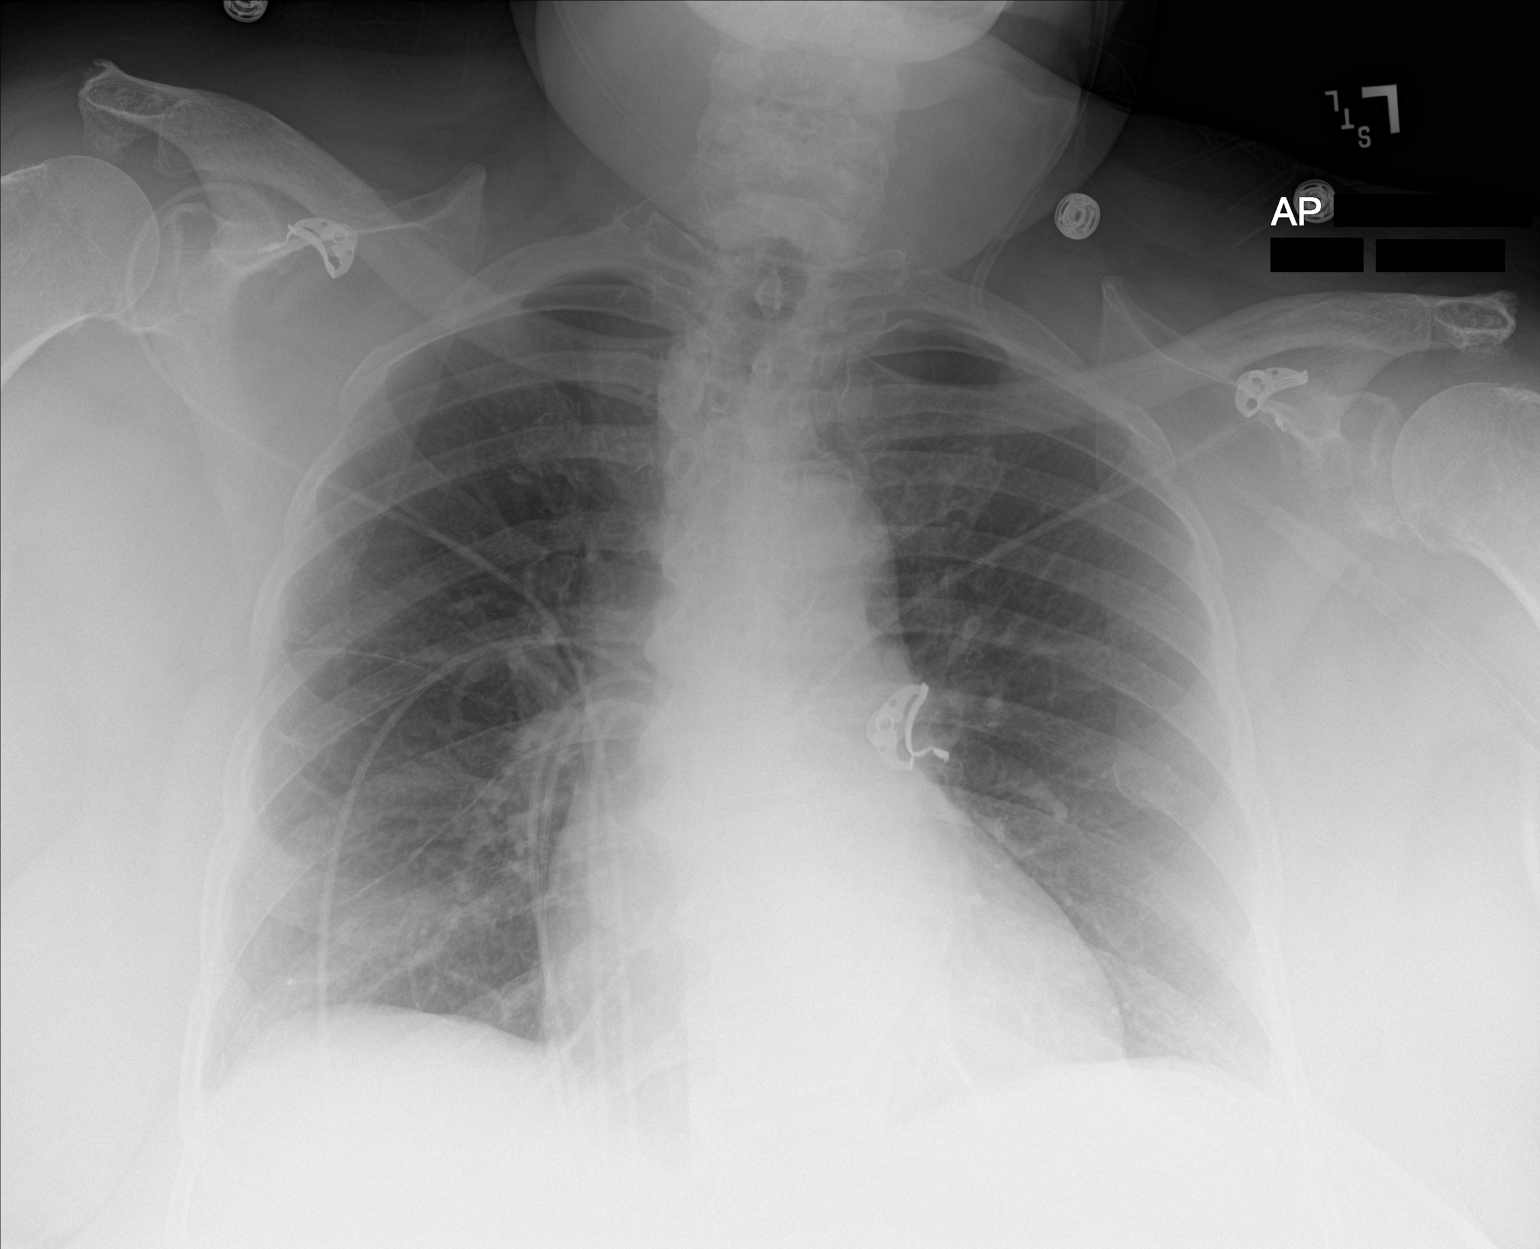

[1 of 1 positions shown; findings below may reference images not displayed]

FINDINGS: Lungs are clear. Heart size and pulmonary vascularity are normal. No
adenopathy. There is degenerative change in the thoracic spine.
IMPRESSION: Lungs clear.  Cardiac silhouette within normal limits.

## 2022-01-12 ENCOUNTER — Emergency Department (HOSPITAL_BASED_OUTPATIENT_CLINIC_OR_DEPARTMENT_OTHER)
Admission: EM | Admit: 2022-01-12 | Discharge: 2022-01-12 | Disposition: A | Discharge status: Home / Self Care | Payer: Medicare HMO | Attending: Emergency Medicine | Admitting: Emergency Medicine

## 2022-01-12 ENCOUNTER — Emergency Department (HOSPITAL_BASED_OUTPATIENT_CLINIC_OR_DEPARTMENT_OTHER): Payer: Medicare HMO

## 2022-01-12 ENCOUNTER — Other Ambulatory Visit: Payer: Self-pay

## 2022-01-12 ENCOUNTER — Other Ambulatory Visit (HOSPITAL_BASED_OUTPATIENT_CLINIC_OR_DEPARTMENT_OTHER): Payer: Self-pay

## 2022-01-12 ENCOUNTER — Encounter (HOSPITAL_BASED_OUTPATIENT_CLINIC_OR_DEPARTMENT_OTHER): Payer: Self-pay | Admitting: Emergency Medicine

## 2022-01-12 DIAGNOSIS — Y92000 Kitchen of unspecified non-institutional (private) residence as  the place of occurrence of the external cause: Secondary | ICD-10-CM | POA: Insufficient documentation

## 2022-01-12 DIAGNOSIS — W19XXXA Unspecified fall, initial encounter: Secondary | ICD-10-CM

## 2022-01-12 DIAGNOSIS — M25562 Pain in left knee: Secondary | ICD-10-CM | POA: Diagnosis not present

## 2022-01-12 DIAGNOSIS — M25512 Pain in left shoulder: Secondary | ICD-10-CM | POA: Diagnosis not present

## 2022-01-12 DIAGNOSIS — S0083XA Contusion of other part of head, initial encounter: Secondary | ICD-10-CM | POA: Insufficient documentation

## 2022-01-12 DIAGNOSIS — S0990XA Unspecified injury of head, initial encounter: Secondary | ICD-10-CM | POA: Diagnosis present

## 2022-01-12 MED ORDER — OXYCODONE HCL 5 MG PO TABS
5.0000 mg | ORAL_TABLET | Freq: Once | ORAL | Status: AC
  Administered 2022-01-12: 5 mg via ORAL
  Filled 2022-01-12: qty 1

## 2022-01-12 MED ORDER — IBUPROFEN 600 MG PO TABS
600.0000 mg | ORAL_TABLET | Freq: Four times a day (QID) | ORAL | 0 refills | Status: AC | PRN
  Filled 2022-01-12: qty 12, 3d supply, fill #0

## 2022-01-12 MED ORDER — KETOROLAC TROMETHAMINE 30 MG/ML IJ SOLN
30.0000 mg | Freq: Once | INTRAMUSCULAR | Status: AC
  Administered 2022-01-12: 30 mg via INTRAMUSCULAR
  Filled 2022-01-12: qty 1

## 2022-01-12 MED ORDER — OXYCODONE HCL 5 MG PO TABS
5.0000 mg | ORAL_TABLET | Freq: Three times a day (TID) | ORAL | 0 refills | Status: AC | PRN
  Filled 2022-01-12: qty 9, 3d supply, fill #0

## 2022-01-12 NOTE — ED Notes (Signed)
Patient transported to X-ray 

## 2022-01-12 NOTE — ED Provider Notes (Signed)
[facility] EMERGENCY DEPARTMENT Provider Note   CSN: [id number] Arrival date & time: 01/12/22  1022     History  Chief Complaint  Patient presents with   Laura French is a 82 y.o. female.  Patient is an 82 year old female presenting for mechanical fall in her kitchen this morning with head injury.  Patient denies any LOC or blood thinner use.  States she had difficulty getting up due to feeling confused after head trauma.  Admits to hematoma to the forehead and left-sided shoulder and left knee pain.  Otherwise denies any neck or back pain.  Nuys any difficulty ambulating.  The history is provided by the patient. No language interpreter was used.  Fall Pertinent negatives include no chest pain, no abdominal pain and no shortness of breath.       Home Medications Prior to Admission medications   Medication Sig Start Date End Date Taking? Authorizing Provider  albuterol (VENTOLIN HFA) 108 (90 Base) MCG/ACT inhaler Inhale 2 puffs into the lungs every 6 (six) hours as needed for wheezing. 06/29/11   [provider]  amLODipine (NORVASC) 10 MG tablet Take 1 tablet (10 mg total) by mouth daily. 10/22/19   Rodolph Bong, MD  BREO ELLIPTA 100-25 MCG/INH AEPB Inhale 1 puff into the lungs daily. 08/27/19   [provider]  carvedilol (COREG) 12.5 MG tablet Take 1 tablet (12.5 mg total) by mouth 2 (two) times daily with a meal. 10/21/19   Rodolph Bong, MD  Cholecalciferol 50 MCG (2000 UT) TABS Take 1 tablet by mouth daily.    [provider]  diphenhydrAMINE (BENADRYL) 25 MG tablet Take 0.5 tablets (12.5 mg total) by mouth daily for 3 days. 10/21/19 10/24/19  Rodolph Bong, MD  DULoxetine (CYMBALTA) 60 MG capsule Take 60 mg by mouth daily. 10/01/19   [provider]  ENBREL MINI 50 MG/ML SOCT Inject 4 mLs into the skin every 28 (twenty-eight) days. 10/02/19   [provider]  famotidine (PEPCID) 20 MG tablet Take  1 tablet (20 mg total) by mouth daily for 3 days. 10/21/19 10/24/19  Rodolph Bong, MD  ferrous sulfate 324 MG TBEC Take 1 tablet by mouth daily. 03/08/19   [provider]  metFORMIN (GLUCOPHAGE) 500 MG tablet Take 500 mg by mouth 2 (two) times daily. 09/30/19   [provider]  montelukast (SINGULAIR) 10 MG tablet Take 10 mg by mouth daily. 09/26/19   [provider]  omeprazole (PRILOSEC) 20 MG capsule Take 1 capsule by mouth daily. 12/11/15   [provider]  polyethylene glycol powder (GLYCOLAX/MIRALAX) 17 GM/SCOOP powder Take 17 g by mouth daily. Mix 1 capful in liquid and dink every day 05/25/11   [provider]  tiZANidine (ZANAFLEX) 4 MG tablet Take 2 tablets by mouth at bedtime. 06/10/16   [provider]      Allergies    Lisinopril, Hydrocodone, Hydrocodone-acetaminophen, Morphine, Potassium-containing compounds, and Prednisone    Review of Systems   Review of Systems  Constitutional:  Negative for chills and fever.  HENT:  Negative for ear pain and sore throat.   Eyes:  Negative for pain and visual disturbance.  Respiratory:  Negative for cough and shortness of breath.   Cardiovascular:  Negative for chest pain and palpitations.  Gastrointestinal:  Negative for abdominal pain and vomiting.  Genitourinary:  Negative for dysuria and hematuria.  Musculoskeletal:  Negative for arthralgias and back pain.  Skin:  Negative  for color change and rash.  Neurological:  Negative for seizures and syncope.  All other systems reviewed and are negative.   Physical Exam Updated Vital Signs BP (!) 122/59   Pulse (!) 57   Temp 97.8 F (36.6 C) (Oral)   Resp 14   Ht 5\' 4"  (1.626 m)   Wt 99.8 kg   SpO2 96%   BMI 37.76 kg/m  Physical Exam Vitals and nursing note reviewed.  Constitutional:      General: She is not in acute distress.    Appearance: She is well-developed.  HENT:     Head: Contusion present.   Eyes:     General:  Lids are normal. Vision grossly intact.     Conjunctiva/sclera: Conjunctivae normal.     Pupils: Pupils are equal, round, and reactive to light.  Cardiovascular:     Rate and Rhythm: Normal rate and regular rhythm.     Heart sounds: No murmur heard. Pulmonary:     Effort: Pulmonary effort is normal. No respiratory distress.     Breath sounds: Normal breath sounds.  Abdominal:     Palpations: Abdomen is soft.     Tenderness: There is no abdominal tenderness.  Musculoskeletal:        General: No swelling.     Right shoulder: Normal.     Left shoulder: Bony tenderness present.     Right upper arm: Normal.     Left upper arm: Normal.     Right elbow: Normal.     Left elbow: Normal.     Right forearm: Normal.     Left forearm: Normal.     Right wrist: Normal.     Left wrist: Normal.     Cervical back: Neck supple. No tenderness or bony tenderness.     Thoracic back: No tenderness or bony tenderness.     Lumbar back: No tenderness or bony tenderness.     Right hip: No deformity, tenderness or bony tenderness.     Left hip: No deformity, tenderness or bony tenderness.     Right upper leg: No tenderness or bony tenderness.     Left upper leg: No tenderness or bony tenderness.     Right knee: No bony tenderness.     Left knee: Bony tenderness present.     Right lower leg: No deformity, tenderness or bony tenderness.     Left lower leg: No deformity, tenderness or bony tenderness.     Right ankle: No tenderness.     Left ankle: No tenderness.  Skin:    General: Skin is warm and dry.     Capillary Refill: Capillary refill takes less than 2 seconds.  Neurological:     Mental Status: She is alert and oriented to person, place, and time.     GCS: GCS eye subscore is 4. GCS verbal subscore is 5. GCS motor subscore is 6.     Cranial Nerves: Cranial nerves 2-12 are intact.     Sensory: Sensation is intact.     Motor: Motor function is intact.     Coordination: Coordination is intact.      Gait: Gait is intact.  Psychiatric:        Mood and Affect: Mood normal.     ED Results / Procedures / Treatments   Labs (all labs ordered are listed, but only abnormal results are displayed) Labs Reviewed - No data to display  EKG None  Radiology DG Knee 2 Views Left  Result  Date: 01/12/2022 CLINICAL DATA:  Fall today.  Left knee pain. EXAM: LEFT KNEE - 1-2 VIEW COMPARISON:  None Available. FINDINGS: No fracture.  No bone lesion. Mild medial joint space compartment narrowing with associated marginal osteophytes. Small marginal osteophytes from the patella. Chondrocalcinosis noted along the menisci. No joint effusion. There are posterior arterial vascular calcifications. Soft tissues otherwise unremarkable. IMPRESSION: 1. No fracture or acute finding. 2. 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 Electronically Signed   By: Lajean Manes M.D.   On: 01/12/2022 13:26   CT Head Wo Contrast  Result Date: 01/12/2022 CLINICAL DATA:  Fall EXAM: CT HEAD WITHOUT CONTRAST CT CERVICAL SPINE WITHOUT CONTRAST TECHNIQUE: Multidetector CT imaging of the head and cervical spine was performed following the standard protocol without intravenous contrast. Multiplanar CT image reconstructions of the cervical spine were also generated. RADIATION DOSE REDUCTION: This exam was performed according to the departmental dose-optimization program which includes automated exposure control, adjustment of the mA and/or kV according to patient size and/or use of iterative reconstruction technique. COMPARISON:  None Available. FINDINGS: CT HEAD FINDINGS Brain: There is no acute intracranial hemorrhage, extra-axial fluid collection, or acute infarct. Parenchymal volume is normal for age. The ventricles are normal in size. Ariv Penrod-white differentiation is preserved The pituitary and suprasellar region are normal. There is no mass lesion. There is no mass effect or midline shift. Vascular: There is  calcification of the bilateral carotid siphons. Skull: Normal. Negative for fracture or focal lesion. Sinuses/Orbits: There is a remote fracture of the left lamina papyracea. Bilateral lens implants are in place. The globes and orbits are otherwise unremarkable. The imaged paranasal sinuses are clear. Other: None. CT CERVICAL SPINE FINDINGS Alignment: There is straightened curvature. There is no antero or retrolisthesis. There is no jumped or perched facet or other evidence of traumatic malalignment. Skull base and vertebrae: Skull base alignment is maintained. Vertebral body heights are preserved. There is no acute fracture. There is no suspicious osseous lesion. Soft tissues and spinal canal: No prevertebral fluid or swelling. No visible canal hematoma. Disc levels: There is unchanged degenerative pannus about the dens with mild narrowing of the craniocervical junction. Multilevel disc space narrowing and degenerative endplate changes are again seen, most advanced at C5-C6 and C6-C7. There is overall relatively mild multilevel facet arthropathy, most advanced on the left at C3-C4. There is no evidence of high-grade spinal canal stenosis. Upper chest: The imaged lung apices are clear. Other: The enlarged thyroid is unchanged. IMPRESSION: 1. No acute intracranial pathology. 2. No acute fracture or traumatic malalignment of the cervical spine. 3. Unchanged enlarged thyroid for which nonemergent thyroid ultrasound is recommended as clinically indicated given patient age, if not already performed. Electronically Signed   By: Valetta Mole M.D.   On: 01/12/2022 11:58   CT Cervical Spine Wo Contrast  Result Date: 01/12/2022 CLINICAL DATA:  Fall EXAM: CT HEAD WITHOUT CONTRAST CT CERVICAL SPINE WITHOUT CONTRAST TECHNIQUE: Multidetector CT imaging of the head and cervical spine was performed following the standard protocol without intravenous contrast. Multiplanar CT image reconstructions of the cervical spine were also  generated. RADIATION DOSE REDUCTION: This exam was performed according to the departmental dose-optimization program which includes automated exposure control, adjustment of the mA and/or kV according to patient size and/or use of iterative reconstruction technique. COMPARISON:  None Available. FINDINGS: CT HEAD FINDINGS Brain: There is no acute intracranial hemorrhage, extra-axial fluid collection, or acute infarct. Parenchymal volume is normal for age. The ventricles are normal in size.  Eliot Popper-white differentiation is preserved The pituitary and suprasellar region are normal. There is no mass lesion. There is no mass effect or midline shift. Vascular: There is calcification of the bilateral carotid siphons. Skull: Normal. Negative for fracture or focal lesion. Sinuses/Orbits: There is a remote fracture of the left lamina papyracea. Bilateral lens implants are in place. The globes and orbits are otherwise unremarkable. The imaged paranasal sinuses are clear. Other: None. CT CERVICAL SPINE FINDINGS Alignment: There is straightened curvature. There is no antero or retrolisthesis. There is no jumped or perched facet or other evidence of traumatic malalignment. Skull base and vertebrae: Skull base alignment is maintained. Vertebral body heights are preserved. There is no acute fracture. There is no suspicious osseous lesion. Soft tissues and spinal canal: No prevertebral fluid or swelling. No visible canal hematoma. Disc levels: There is unchanged degenerative pannus about the dens with mild narrowing of the craniocervical junction. Multilevel disc space narrowing and degenerative endplate changes are again seen, most advanced at C5-C6 and C6-C7. There is overall relatively mild multilevel facet arthropathy, most advanced on the left at C3-C4. There is no evidence of high-grade spinal canal stenosis. Upper chest: The imaged lung apices are clear. Other: The enlarged thyroid is unchanged. IMPRESSION: 1. No acute  intracranial pathology. 2. No acute fracture or traumatic malalignment of the cervical spine. 3. Unchanged enlarged thyroid for which nonemergent thyroid ultrasound is recommended as clinically indicated given patient age, if not already performed. Electronically Signed   By: Lesia Hausen M.D.   On: 01/12/2022 11:58   DG Shoulder Left  Result Date: 01/12/2022 CLINICAL DATA:  Trauma, fall, pain EXAM: LEFT SHOULDER - 2+ VIEW COMPARISON:  09/12/2020 FINDINGS: No recent fracture or dislocation is seen. Degenerative changes are noted with bony spurs in left shoulder and AC joints. Bony spurs seen in acromion. No significant interval changes are noted. IMPRESSION: No recent fracture or dislocation is seen. Degenerative changes are noted in left shoulder and AC joints. Electronically Signed   By: Ernie Avena M.D.   On: 01/12/2022 11:53    Procedures Procedures    Medications Ordered in ED Medications  ketorolac (TORADOL) 30 MG/ML injection 30 mg (30 mg Intramuscular Given 01/12/22 1252)  oxyCODONE (Oxy IR/ROXICODONE) immediate release tablet 5 mg (5 mg Oral Given 01/12/22 1252)    ED Course/ Medical Decision Making/ A&P                           Medical Decision Making Amount and/or Complexity of Data Reviewed Radiology: ordered.  Risk Prescription drug management.   80:26 PM 82 year old female presenting for mechanical fall in her kitchen this morning with head injury.  Patient is alert oriented x 3, no acute distress, afebrile, stable vital signs.  Physical exam demonstrates hematoma to the forehead.  Otherwise no bony facial pain.  No cervical or spinal tenderness.  CT head and neck demonstrates no acute process.  Patient has tenderness palpation of the left shoulder and left knee without gross deformities.  X-rays demonstrate no acute fractures.  Osteoarthritis present.  Patient's fall seems to be mechanical in nature.  No injury indication for laboratory studies currently.  Recommend  return home in the care of her daughter with fall prevention.  Patient in no distress and overall condition improved here in the ED. Detailed discussions were had with the patient regarding current findings, and need for close f/u with PCP or on call doctor. The patient has been  instructed to return immediately if the symptoms worsen in any way for re-evaluation. Patient verbalized understanding and is in agreement with current care plan. All questions answered prior to discharge.         Final Clinical Impression(s) / ED Diagnoses Final diagnoses:  Fall, initial encounter  Injury of head, initial encounter  Acute pain of left shoulder  Acute pain of left knee    Rx / DC Orders ED Discharge Orders     None         Lianne Cure, DO 07/03/14 1427

## 2022-01-12 NOTE — ED Triage Notes (Signed)
Pt BIB [facility] Pt reports fall today at her kitchen , head injury , forehead hematoma , sts lost consciousness briefly , denies neck pain , also report left shoulder pain and left knee , Pt alert and oriented x 4 .
